# Patient Record
Sex: Male | Born: 1970 | Race: White | Hispanic: No | Marital: Married | Smoking: Never smoker
Health system: Southern US, Community
[De-identification: ages and names within clinical notes are randomized; demographics above are authoritative.]

## PROBLEM LIST (undated history)

## (undated) DIAGNOSIS — M329 Systemic lupus erythematosus, unspecified: Secondary | ICD-10-CM

## (undated) DIAGNOSIS — I10 Essential (primary) hypertension: Secondary | ICD-10-CM

## (undated) DIAGNOSIS — Z22322 Carrier or suspected carrier of Methicillin resistant Staphylococcus aureus: Secondary | ICD-10-CM

## (undated) DIAGNOSIS — E119 Type 2 diabetes mellitus without complications: Secondary | ICD-10-CM

## (undated) HISTORY — PX: KNEE ARTHROSCOPY: SUR90

## (undated) HISTORY — PX: APPENDECTOMY: SHX54

---

## 2018-09-07 ENCOUNTER — Emergency Department (HOSPITAL_COMMUNITY): Payer: Medicaid - Out of State

## 2018-09-07 ENCOUNTER — Encounter (HOSPITAL_COMMUNITY): Payer: Self-pay | Admitting: Emergency Medicine

## 2018-09-07 ENCOUNTER — Other Ambulatory Visit: Payer: Self-pay

## 2018-09-07 ENCOUNTER — Inpatient Hospital Stay (HOSPITAL_COMMUNITY)
Admission: EM | Admit: 2018-09-07 | Discharge: 2018-09-10 | DRG: 623 | Disposition: A | Discharge status: Home / Self Care | Payer: Medicaid - Out of State | Attending: Internal Medicine | Admitting: Internal Medicine

## 2018-09-07 DIAGNOSIS — Z794 Long term (current) use of insulin: Secondary | ICD-10-CM

## 2018-09-07 DIAGNOSIS — L97519 Non-pressure chronic ulcer of other part of right foot with unspecified severity: Secondary | ICD-10-CM | POA: Diagnosis present

## 2018-09-07 DIAGNOSIS — G4730 Sleep apnea, unspecified: Secondary | ICD-10-CM | POA: Diagnosis present

## 2018-09-07 DIAGNOSIS — Z79899 Other long term (current) drug therapy: Secondary | ICD-10-CM

## 2018-09-07 DIAGNOSIS — Z6841 Body Mass Index (BMI) 40.0 and over, adult: Secondary | ICD-10-CM

## 2018-09-07 DIAGNOSIS — B9562 Methicillin resistant Staphylococcus aureus infection as the cause of diseases classified elsewhere: Secondary | ICD-10-CM | POA: Diagnosis present

## 2018-09-07 DIAGNOSIS — E871 Hypo-osmolality and hyponatremia: Secondary | ICD-10-CM | POA: Diagnosis present

## 2018-09-07 DIAGNOSIS — E11621 Type 2 diabetes mellitus with foot ulcer: Secondary | ICD-10-CM | POA: Diagnosis present

## 2018-09-07 DIAGNOSIS — L03115 Cellulitis of right lower limb: Secondary | ICD-10-CM | POA: Diagnosis present

## 2018-09-07 DIAGNOSIS — Z23 Encounter for immunization: Secondary | ICD-10-CM | POA: Diagnosis not present

## 2018-09-07 DIAGNOSIS — E1142 Type 2 diabetes mellitus with diabetic polyneuropathy: Secondary | ICD-10-CM | POA: Diagnosis present

## 2018-09-07 DIAGNOSIS — I10 Essential (primary) hypertension: Secondary | ICD-10-CM | POA: Diagnosis present

## 2018-09-07 DIAGNOSIS — E11628 Type 2 diabetes mellitus with other skin complications: Secondary | ICD-10-CM | POA: Diagnosis present

## 2018-09-07 DIAGNOSIS — Z8614 Personal history of Methicillin resistant Staphylococcus aureus infection: Secondary | ICD-10-CM | POA: Diagnosis not present

## 2018-09-07 DIAGNOSIS — E119 Type 2 diabetes mellitus without complications: Secondary | ICD-10-CM

## 2018-09-07 DIAGNOSIS — L98493 Non-pressure chronic ulcer of skin of other sites with necrosis of muscle: Secondary | ICD-10-CM

## 2018-09-07 DIAGNOSIS — E1165 Type 2 diabetes mellitus with hyperglycemia: Secondary | ICD-10-CM | POA: Diagnosis present

## 2018-09-07 DIAGNOSIS — L089 Local infection of the skin and subcutaneous tissue, unspecified: Secondary | ICD-10-CM

## 2018-09-07 HISTORY — DX: Carrier or suspected carrier of methicillin resistant Staphylococcus aureus: Z22.322

## 2018-09-07 HISTORY — DX: Type 2 diabetes mellitus without complications: E11.9

## 2018-09-07 HISTORY — DX: Essential (primary) hypertension: I10

## 2018-09-07 LAB — BASIC METABOLIC PANEL
Anion gap: 10 (ref 5–15)
BUN: 16 mg/dL (ref 6–20)
CALCIUM: 8.7 mg/dL — AB (ref 8.9–10.3)
CO2: 26 mmol/L (ref 22–32)
Chloride: 97 mmol/L — ABNORMAL LOW (ref 98–111)
Creatinine, Ser: 1.33 mg/dL — ABNORMAL HIGH (ref 0.61–1.24)
GFR calc Af Amer: >60 mL/min (ref >60)
GFR calc non Af Amer: >60 mL/min (ref >60)
GLUCOSE: 395 mg/dL — AB (ref 70–99)
Potassium: 3.8 mmol/L (ref 3.5–5.1)
Sodium: 133 mmol/L — ABNORMAL LOW (ref 135–145)

## 2018-09-07 LAB — CBC WITH DIFFERENTIAL/PLATELET
ABS IMMATURE GRANULOCYTES: 0.02 10*3/uL (ref 0.00–0.07)
Basophils Absolute: 0.1 10*3/uL (ref 0.0–0.1)
Basophils Relative: 1 %
Eosinophils Absolute: 0.3 10*3/uL (ref 0.0–0.5)
Eosinophils Relative: 4 %
HCT: 41.7 % (ref 39.0–52.0)
Hemoglobin: 13.8 g/dL (ref 13.0–17.0)
Immature Granulocytes: 0 %
Lymphocytes Relative: 26 %
Lymphs Abs: 2 10*3/uL (ref 0.7–4.0)
MCH: 29.4 pg (ref 26.0–34.0)
MCHC: 33.1 g/dL (ref 30.0–36.0)
MCV: 88.7 fL (ref 80.0–100.0)
MONO ABS: 0.4 10*3/uL (ref 0.1–1.0)
MONOS PCT: 5 %
NEUTROS ABS: 4.9 10*3/uL (ref 1.7–7.7)
Neutrophils Relative %: 64 %
Platelets: 388 10*3/uL (ref 150–400)
RBC: 4.7 MIL/uL (ref 4.22–5.81)
RDW: 12 % (ref 11.5–15.5)
WBC: 7.7 10*3/uL (ref 4.0–10.5)
nRBC: 0 % (ref 0.0–0.2)

## 2018-09-07 LAB — HEMOGLOBIN A1C
HEMOGLOBIN A1C: 10.5 % — AB (ref 4.8–5.6)
Mean Plasma Glucose: 254.65 mg/dL

## 2018-09-07 LAB — GLUCOSE, CAPILLARY
GLUCOSE-CAPILLARY: 245 mg/dL — AB (ref 70–99)
Glucose-Capillary: 265 mg/dL — ABNORMAL HIGH (ref 70–99)

## 2018-09-07 LAB — SEDIMENTATION RATE: SED RATE: 28 mm/h — AB (ref 0–16)

## 2018-09-07 LAB — CBG MONITORING, ED: Glucose-Capillary: 402 mg/dL — ABNORMAL HIGH (ref 70–99)

## 2018-09-07 LAB — PROTIME-INR
INR: 0.83
PROTHROMBIN TIME: 11.3 s — AB (ref 11.4–15.2)

## 2018-09-07 LAB — C-REACTIVE PROTEIN: CRP: 1.8 mg/dL — AB (ref <1.0)

## 2018-09-07 MED ORDER — ENOXAPARIN SODIUM 40 MG/0.4ML ~~LOC~~ SOLN
40.0000 mg | SUBCUTANEOUS | Status: DC
  Administered 2018-09-07: 40 mg via SUBCUTANEOUS
  Filled 2018-09-07: qty 0.4

## 2018-09-07 MED ORDER — SODIUM CHLORIDE 0.9 % IV SOLN
250.0000 mL | INTRAVENOUS | Status: DC | PRN
  Administered 2018-09-08: 250 mL via INTRAVENOUS

## 2018-09-07 MED ORDER — LISINOPRIL 10 MG PO TABS
20.0000 mg | ORAL_TABLET | Freq: Every day | ORAL | Status: DC
  Administered 2018-09-08 – 2018-09-10 (×3): 20 mg via ORAL
  Filled 2018-09-07 (×3): qty 2

## 2018-09-07 MED ORDER — GLIPIZIDE 5 MG PO TABS
10.0000 mg | ORAL_TABLET | Freq: Every day | ORAL | Status: DC
  Filled 2018-09-07: qty 2
  Filled 2018-09-07 (×2): qty 1

## 2018-09-07 MED ORDER — ONDANSETRON HCL 4 MG/2ML IJ SOLN: 4.0000 mg | Freq: Four times a day (QID) | INTRAMUSCULAR | Status: DC | PRN

## 2018-09-07 MED ORDER — METFORMIN HCL 500 MG PO TABS
1000.0000 mg | ORAL_TABLET | Freq: Two times a day (BID) | ORAL | Status: DC
  Administered 2018-09-07 – 2018-09-10 (×6): 1000 mg via ORAL
  Filled 2018-09-07 (×6): qty 2

## 2018-09-07 MED ORDER — ROPINIROLE HCL 0.25 MG PO TABS
0.5000 mg | ORAL_TABLET | Freq: Every day | ORAL | Status: DC
  Administered 2018-09-07 – 2018-09-09 (×3): 0.5 mg via ORAL
  Filled 2018-09-07 (×2): qty 1
  Filled 2018-09-07 (×2): qty 2

## 2018-09-07 MED ORDER — ACETAMINOPHEN 325 MG PO TABS: 650.0000 mg | ORAL_TABLET | Freq: Four times a day (QID) | ORAL | Status: DC | PRN

## 2018-09-07 MED ORDER — VITAMIN D (ERGOCALCIFEROL) 1.25 MG (50000 UNIT) PO CAPS: 50000.0000 [IU] | ORAL_CAPSULE | ORAL | Status: DC

## 2018-09-07 MED ORDER — ESCITALOPRAM OXALATE 10 MG PO TABS
10.0000 mg | ORAL_TABLET | Freq: Every day | ORAL | Status: DC
  Administered 2018-09-08 – 2018-09-10 (×3): 10 mg via ORAL
  Filled 2018-09-07 (×3): qty 1

## 2018-09-07 MED ORDER — ACETAMINOPHEN 650 MG RE SUPP: 650.0000 mg | Freq: Four times a day (QID) | RECTAL | Status: DC | PRN

## 2018-09-07 MED ORDER — GABAPENTIN 100 MG PO CAPS
100.0000 mg | ORAL_CAPSULE | Freq: Three times a day (TID) | ORAL | Status: DC
  Administered 2018-09-07 – 2018-09-10 (×8): 100 mg via ORAL
  Filled 2018-09-07 (×8): qty 1

## 2018-09-07 MED ORDER — INSULIN ASPART 100 UNIT/ML IV SOLN
10.0000 [IU] | Freq: Once | INTRAVENOUS | Status: AC
  Administered 2018-09-07: 10 [IU] via INTRAVENOUS

## 2018-09-07 MED ORDER — SODIUM CHLORIDE 0.9% FLUSH: 3.0000 mL | INTRAVENOUS | Status: DC | PRN

## 2018-09-07 MED ORDER — INFLUENZA VAC SPLIT QUAD 0.5 ML IM SUSY
0.5000 mL | PREFILLED_SYRINGE | INTRAMUSCULAR | Status: AC
  Administered 2018-09-08: 0.5 mL via INTRAMUSCULAR

## 2018-09-07 MED ORDER — HYDROCHLOROTHIAZIDE 25 MG PO TABS
25.0000 mg | ORAL_TABLET | Freq: Every day | ORAL | Status: DC
  Administered 2018-09-08 – 2018-09-10 (×3): 25 mg via ORAL
  Filled 2018-09-07 (×3): qty 1

## 2018-09-07 MED ORDER — VANCOMYCIN HCL IN DEXTROSE 1-5 GM/200ML-% IV SOLN
1000.0000 mg | Freq: Two times a day (BID) | INTRAVENOUS | Status: DC
  Administered 2018-09-08 – 2018-09-09 (×4): 1000 mg via INTRAVENOUS
  Filled 2018-09-07 (×5): qty 200

## 2018-09-07 MED ORDER — SODIUM CHLORIDE 0.9% FLUSH
3.0000 mL | Freq: Two times a day (BID) | INTRAVENOUS | Status: DC
  Administered 2018-09-07 – 2018-09-10 (×5): 3 mL via INTRAVENOUS

## 2018-09-07 MED ORDER — INSULIN DETEMIR 100 UNIT/ML ~~LOC~~ SOLN
25.0000 [IU] | Freq: Every evening | SUBCUTANEOUS | Status: DC
  Administered 2018-09-07 – 2018-09-08 (×2): 25 [IU] via SUBCUTANEOUS
  Filled 2018-09-07 (×3): qty 0.25

## 2018-09-07 MED ORDER — AMITRIPTYLINE HCL 10 MG PO TABS
10.0000 mg | ORAL_TABLET | Freq: Every day | ORAL | Status: DC
  Administered 2018-09-08 – 2018-09-10 (×3): 10 mg via ORAL
  Filled 2018-09-07 (×3): qty 1

## 2018-09-07 MED ORDER — LISINOPRIL-HYDROCHLOROTHIAZIDE 20-25 MG PO TABS: 1.0000 | ORAL_TABLET | Freq: Every day | ORAL | Status: DC

## 2018-09-07 MED ORDER — AMMONIUM LACTATE 12 % EX LOTN
TOPICAL_LOTION | Freq: Every day | CUTANEOUS | Status: DC
  Filled 2018-09-07: qty 400

## 2018-09-07 MED ORDER — NIACIN 100 MG PO TABS
100.0000 mg | ORAL_TABLET | Freq: Every day | ORAL | Status: DC
  Administered 2018-09-10: 100 mg via ORAL
  Filled 2018-09-07 (×4): qty 1

## 2018-09-07 MED ORDER — INSULIN ASPART 100 UNIT/ML ~~LOC~~ SOLN
0.0000 [IU] | Freq: Every day | SUBCUTANEOUS | Status: DC
  Administered 2018-09-07: 2 [IU] via SUBCUTANEOUS
  Administered 2018-09-08: 5 [IU] via SUBCUTANEOUS

## 2018-09-07 MED ORDER — GLIPIZIDE ER 5 MG PO TB24
5.0000 mg | ORAL_TABLET | Freq: Every day | ORAL | Status: DC
  Filled 2018-09-07: qty 1

## 2018-09-07 MED ORDER — INSULIN ASPART 100 UNIT/ML ~~LOC~~ SOLN
0.0000 [IU] | Freq: Three times a day (TID) | SUBCUTANEOUS | Status: DC
  Administered 2018-09-07: 8 [IU] via SUBCUTANEOUS
  Administered 2018-09-08: 15 [IU] via SUBCUTANEOUS
  Administered 2018-09-08 (×2): 5 [IU] via SUBCUTANEOUS
  Administered 2018-09-09: 8 [IU] via SUBCUTANEOUS

## 2018-09-07 MED ORDER — ROPINIROLE HCL 0.25 MG PO TABS
ORAL_TABLET | ORAL | Status: AC
  Filled 2018-09-07: qty 2

## 2018-09-07 MED ORDER — VANCOMYCIN HCL 10 G IV SOLR
2000.0000 mg | Freq: Once | INTRAVENOUS | Status: AC
  Administered 2018-09-07: 2000 mg via INTRAVENOUS
  Filled 2018-09-07: qty 2000

## 2018-09-07 MED ORDER — VANCOMYCIN HCL IN DEXTROSE 750-5 MG/150ML-% IV SOLN
750.0000 mg | Freq: Two times a day (BID) | INTRAVENOUS | Status: DC
  Filled 2018-09-07 (×3): qty 150

## 2018-09-07 MED ORDER — MELOXICAM 7.5 MG PO TABS
15.0000 mg | ORAL_TABLET | Freq: Every day | ORAL | Status: DC
  Administered 2018-09-08 – 2018-09-10 (×3): 15 mg via ORAL
  Filled 2018-09-07 (×2): qty 2
  Filled 2018-09-07 (×2): qty 1
  Filled 2018-09-07: qty 2

## 2018-09-07 MED ORDER — ONDANSETRON HCL 4 MG PO TABS: 4.0000 mg | ORAL_TABLET | Freq: Four times a day (QID) | ORAL | Status: DC | PRN

## 2018-09-07 NOTE — ED Provider Notes (Signed)
[facility] Provider Note   CSN: [id number] Arrival date & time: 09/07/18  1318     History   Chief Complaint Chief Complaint  Patient presents with  . Abnormal Lab    HPI Evan Moore is a 47 y.o. male.  He has a history of diabetes and peripheral neuropathy.  He has had a nonhealing ulcer on the lateral side of his right foot for almost 2 months.  He follows with [name] who is been debriding it.  He has had a recent MRI per the patient that was negative for osteo-.  He is sent in today by [name] to be admitted to the hospitalist service for IV antibiotics in anticipation for surgery on Monday.  He said he is had on and off fevers usually when he is off antibiotics.  He recently completed 10 days of antibiotics and was off some for a couple of days when the fevers recurred.  He is currently on Cipro and Clinda for 2 days.  No nausea no vomiting no chest pain no shortness of breath.  Due to his neuropathy he has no foot pain.  The history is provided by the patient.  Wound Check  This is a chronic problem. Episode onset: 2 months. The problem occurs constantly. The problem has not changed since onset.Pertinent negatives include no chest pain, no abdominal pain, no headaches and no shortness of breath. Nothing aggravates the symptoms. Nothing relieves the symptoms. The treatment provided no relief.    Past Medical History:  Diagnosis Date  . Diabetes mellitus without complication (HCC)   . Hypertension   . MRSA (methicillin resistant staph aureus) culture positive     There are no active problems to display for this patient.   Past Surgical History:  Procedure Laterality Date  . APPENDECTOMY    . KNEE ARTHROSCOPY Left         Home Medications    Prior to Admission medications   Medication Sig Start Date End Date Taking? Authorizing Provider  amitriptyline (ELAVIL) 10 MG tablet Take 1 tablet by mouth daily. 08/31/18   [provider]    ammonium lactate (AMLACTIN) 12 % cream Apply 1 application topically at bedtime. 08/24/18   [provider]  ciprofloxacin (CIPRO) 500 MG tablet Take 1 tablet by mouth 2 (two) times daily. 09/05/18   [provider]  clindamycin (CLEOCIN) 300 MG capsule Take 1 capsule by mouth 3 (three) times daily. 09/05/18   [provider]  escitalopram (LEXAPRO) 10 MG tablet Take 10 mg by mouth daily. 08/31/18   [provider]  gabapentin (NEURONTIN) 100 MG capsule Take 1 capsule by mouth 3 (three) times daily. 08/19/18   [provider]  glipiZIDE (GLUCOTROL XL) 5 MG 24 hr tablet Take 1 tablet by mouth daily. 08/20/18   [provider]  glipiZIDE (GLUCOTROL) 10 MG tablet Take 1 tablet by mouth daily. 08/07/18   [provider]  LEVEMIR FLEXTOUCH 100 UNIT/ML Pen Inject 25 Units into the skin every evening. 08/16/18   [provider]  lisinopril-hydrochlorothiazide (PRINZIDE,ZESTORETIC) 20-25 MG tablet Take 1 tablet by mouth daily. 08/30/18   [provider]  meloxicam (MOBIC) 15 MG tablet Take 1 tablet by mouth daily. 08/20/18   [provider]  metFORMIN (GLUCOPHAGE) 1000 MG tablet Take 1 tablet by mouth 2 (two) times daily. 08/26/18   [provider]  niacin 100 MG tablet Take 100 mg by mouth daily. 08/30/18   [provider]  rOPINIRole (REQUIP) 0.5 MG tablet Take 1 tablet by mouth at bedtime. 08/30/18   [provider]  Vitamin D, Ergocalciferol, (DRISDOL) 1.25 MG (50000 UT) CAPS capsule Take 1 capsule by mouth once a week. 08/25/18   [provider]  VOLTAREN 1 % GEL Apply 1 application topically 3 (three) times daily as needed. 08/28/18   [provider]    Family History No family history on file.  Social History Social History   Tobacco Use  . Smoking status: Never Smoker  . Smokeless tobacco: Never Used  Substance Use Topics  . Alcohol use: Not Currently  . Drug  use: Not Currently     Allergies   Patient has no known allergies.   Review of Systems Review of Systems  Constitutional: Positive for fever. Negative for chills.  HENT: Negative for sore throat.   Eyes: Negative for visual disturbance.  Respiratory: Negative for shortness of breath.   Cardiovascular: Negative for chest pain.  Gastrointestinal: Negative for abdominal pain.  Genitourinary: Negative for dysuria.  Musculoskeletal: Negative for neck pain.  Skin: Positive for wound. Negative for rash.  Neurological: Negative for headaches.     Physical Exam Updated Vital Signs BP 116/69   Pulse 85   Temp 98.3 F (36.8 C) (Oral)   Resp 18   Ht 5\' 4"  (1.626 m)   Wt 114.8 kg   SpO2 97%   BMI 43.43 kg/m   Physical Exam  Constitutional: He appears well-developed and well-nourished.  HENT:  Head: Normocephalic and atraumatic.  Eyes: Conjunctivae are normal.  Neck: Neck supple.  Cardiovascular: Normal rate and regular rhythm.  No murmur heard. Pulmonary/Chest: Effort normal and breath sounds normal. No respiratory distress.  Abdominal: Soft. There is no tenderness.  Musculoskeletal: He exhibits no deformity.  Is approximately 2 cm ulceration on the lateral fifth metatarsal head of the right foot.  There is no discharge expressed.  There is a minimal amount of edema of the foot but no significant erythema.  He has decreased sensation on the foot.  Neurological: He is alert.  Skin: Skin is warm and dry.  Psychiatric: He has a normal mood and affect.  Nursing note and vitals reviewed.    ED Treatments / Results  Labs (all labs ordered are listed, but only abnormal results are displayed) Labs Reviewed  BASIC METABOLIC PANEL - Abnormal; Notable for the following components:      Result Value   Sodium 133 (*)    Chloride 97 (*)    Glucose, Bld 395 (*)    Creatinine, Ser 1.33 (*)    Calcium 8.7 (*)    All other components within normal limits  PROTIME-INR - Abnormal;  Notable for the following components:   Prothrombin Time 11.3 (*)    All other components within normal limits  CULTURE, BLOOD (ROUTINE X 2)  CULTURE, BLOOD (ROUTINE X 2)  CBC WITH DIFFERENTIAL/PLATELET    EKG None  Radiology Dg Foot Complete Right  Result Date: 09/07/2018 CLINICAL DATA:  Nonhealing foot ulcer. EXAM: RIGHT FOOT COMPLETE - 3+ VIEW COMPARISON:  None. FINDINGS: Lateral forefoot soft tissue swelling and cavitation. No opaque foreign body, soft tissue emphysema, or erosion. Diffuse arterial calcification. IMPRESSION: Lateral forefoot ulcer without evident osteomyelitis or opaque foreign body. Electronically Signed   By: Marnee Spring M.D.   On: 09/07/2018 15:44    Procedures Procedures (including critical care time)  Medications Ordered in ED Medications  vancomycin (VANCOCIN) 2,000 mg in sodium chloride  0.9 % 500 mL IVPB (2,000 mg Intravenous New Bag/Given 09/07/18 1425)  vancomycin (VANCOCIN) IVPB 1000 mg/200 mL premix (has no administration in time range)     Initial Impression / Assessment and Plan / ED Course  I have reviewed the triage vital signs and the nursing notes.  Pertinent labs & imaging results that were available during my care of the patient were reviewed by me and considered in my medical decision making (see chart for details).  Clinical Course as of Sep 07 1456  Sat Sep 07, 2018  1448 Discussed with Dr. Sherryll Burger from the hospitalist service who will evaluate the patient for admission.   [MB]  1456 Discussed with Dr. Allena Katz from podiatry who is asking for the patient to be admitted to medical service on IV antibiotics and he will stop by for evaluating the patient.  Anticipated for OR on Monday.   [MB]    Clinical Course User Index [MB] Terrilee Files, MD     Final Clinical Impressions(s) / ED Diagnoses   Final diagnoses:  Diabetic foot infection [facility])    ED Discharge Orders    None       Terrilee Files, MD 09/07/18 1734

## 2018-09-07 NOTE — ED Triage Notes (Signed)
Pt having right foot surgery Monday. Due to recent MRSA infection in area [place] sent him to ER to get antibiotics on board so they can do the surgery Monday. Pt on cipro and clindamycin PO already for infection. Pt reports having intermittent fevers

## 2018-09-07 NOTE — Consult Note (Signed)
Podiatry Consult Note.    HPI: Patient seen at bedside today in emergency room. Patient is well known to me, and is been following up with me for chronic ulceration of the right foot. The ulcer grew out MRSA which was sensitive to Clindamycin and I put him on 20 days of clindamycin. The redness never really resolved. When he came into the office on 11/2//19, his foot got worse. He noticed more redness, drainage, feverish. He states the fever comes and goes. I explained him that he needs IV antibiotics with debridement of his foot ulcer. I explained him to go to the ER right away. Him and his wife decided they will go on Saturday due to work. He has been taking clindamycin and cipro. Today he states the redness has improved. There is still drainage.   Past Medical History:  Diagnosis Date  . Diabetes mellitus without complication ([place])   . Hypertension   . MRSA (methicillin resistant staph aureus) culture positive          Past Surgical History:  Procedure Laterality Date  . APPENDECTOMY    . KNEE ARTHROSCOPY Left      reports that he has never smoked. He has never used smokeless tobacco. He reports that he drank alcohol. He reports that he has current or past drug history.  No Known Allergies  No family history on file.         Prior to Admission medications   Medication Sig Start Date End Date Taking? Authorizing Provider  amitriptyline (ELAVIL) 10 MG tablet Take 1 tablet by mouth daily. 08/31/18   [provider]  ammonium lactate (AMLACTIN) 12 % cream Apply 1 application topically at bedtime. 08/24/18   [provider]  ciprofloxacin (CIPRO) 500 MG tablet Take 1 tablet by mouth 2 (two) times daily. 09/05/18   [provider]  clindamycin (CLEOCIN) 300 MG capsule Take 1 capsule by mouth 3 (three) times daily. 09/05/18   [provider]  escitalopram (LEXAPRO) 10 MG tablet Take 10 mg by mouth daily. 08/31/18   [provider]  gabapentin (NEURONTIN) 100 MG capsule Take 1 capsule by mouth 3 (three) times daily. 08/19/18   [provider]  glipiZIDE (GLUCOTROL XL) 5 MG 24 hr tablet Take 1 tablet by mouth daily. 08/20/18   [provider]  glipiZIDE (GLUCOTROL) 10 MG tablet Take 1 tablet by mouth daily. 08/07/18   [provider]  LEVEMIR FLEXTOUCH 100 UNIT/ML Pen Inject 25 Units into the skin every evening. 08/16/18   [provider]  lisinopril-hydrochlorothiazide (PRINZIDE,ZESTORETIC) 20-25 MG tablet Take 1 tablet by mouth daily. 08/30/18   [provider]  meloxicam (MOBIC) 15 MG tablet Take 1 tablet by mouth daily. 08/20/18   [provider]  metFORMIN (GLUCOPHAGE) 1000 MG tablet Take 1 tablet by mouth 2 (two) times daily. 08/26/18   [provider]  niacin 100 MG tablet Take 100 mg by mouth daily. 08/30/18   [provider]  rOPINIRole (REQUIP) 0.5 MG tablet Take 1 tablet by mouth at bedtime. 08/30/18   [provider]  Vitamin D, Ergocalciferol, (DRISDOL) 1.25 MG (50000 UT) CAPS capsule Take 1 capsule by mouth once a week. 08/25/18   [provider]  VOLTAREN 1 % GEL Apply 1 application topically 3 (three) times daily as needed. 08/28/18   [provider]    Physical Exam:      Vitals:   09/07/18 1329 09/07/18 1330 09/07/18 1331  BP: 116/69  Pulse: 85    Resp: 18    Temp: 98.3 F (36.8 C)  98.3 F (36.8 C)  TempSrc: Oral  Oral  SpO2: 97%    Weight:  114.8 kg   Height:  _0  (1.626 m)     Constitutional: NAD, calm, comfortable, obese.      Vitals:   09/07/18 1329 09/07/18 1330 09/07/18 1331  BP: 116/69    Pulse: 85    Resp: 18    Temp: 98.3 F (36.8 C)  98.3 F (36.8 C)  TempSrc: Oral  Oral  SpO2: 97%    Weight:  114.8 kg   Height:  _1  (1.626 m)     CBC: LastLabs     Recent Labs  Lab 09/07/18 1359  WBC 7.7   NEUTROABS 4.9  HGB 13.8  HCT 41.7  MCV 88.7  PLT 388     ESR: Pending. CRP: Pending. A1C: Pending.   Arterial study with ABI and PVRs: Pending.   Physical Exam:  General, alert, awake and oriented.  Vascular: DP pulse faintly palpable. PT pulse faintly palpable. Hair growth noted on the lower leg but diminished on the foot. Foot is warm to touch. Capillary refill time is <3 seconds to the right fifth digit.  Derm: There Is full thickness ulcer noted at the right fifth MPJ laterally, with exposed muscle. Does not probe to bone. There is serosanginous drainage. No malodor noted. periwound erythema noted. No streaking redness noted. Ulcer is measured to be 1.5x1.5x0.5cm deep. No fluctuance noted.  Musculoskeletol: Muscle strength is 5/5.  Neurology: Protective sensation is diminished.   Radiograph: I reviewed the radiograph. No soft tissue emphysema noted. Calcified vessels noted. No bone destruction or bone lysis noted to suggest osteomyelitis.    Assessment/Plan  Right foot infected foot ulcer.  Right foot cellulitis.  Diabetes with neuropathy.    Patient examined and evaluated.  I cleasned the ulcer with normal saline and applied dry dressing.  Patient will need arterial studies done to evaluated the blood flow in the right lower extremity.  Continue IV vancomycin. Wound culture grew MRSA at the office.  Plan for OR debridement and possible application of wound Vac on 09/09/18. Patient will need to be NPO starting Sunday and hold off on Lovenox please.   Plan for discharge on Tuesday from podiatry point of view to home.

## 2018-09-07 NOTE — H&P (Signed)
History and Physical    Evan Moore ONG:[id number] DOB: 07/08/1971 DOA: 09/07/2018  PCP: Ian Malkinoberts, Maurice, FNP   Patient coming from: Home  Chief Complaint: MRSA on foot wound  HPI: Evan Moore is a 47 y.o. male with medical history significant for obesity, type 2 diabetes, peripheral neuropathy, and hypertension who has been dealing with a right foot wound on the lateral side over the last 2 months.  He claims that this may have begun after he was bitten by a spider and has been seen by podiatry and was placed on a course of antibiotics for 14 days.  Unfortunately, he is continued to have some ongoing trouble with the wound and was recently prescribed ciprofloxacin and clindamycin over the last 2 days and cultures were obtained in the office.  Apparently, cultures returned today demonstrated the presence of MRSA and the patient was told to come to the hospital for IV antibiotic treatment until debridement could be performed on Monday.  Patient denies any pain to his foot as he has severe neuropathy and states that he has had minimal clear drainage.  He denies any fevers or chills.  He does state that he has had an MRI about a week ago with no findings of osteomyelitis.   ED Course: Vital signs are stable and labs demonstrate some mild hyponatremia likely related to his severe hyperglycemia of 395.  He claims that he is compliant with his home medications and states that his sugar was checked shortly after lunch.  The rest of his labs are otherwise unremarkable.  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  Review of Systems: All others reviewed and otherwise negative.  Past Medical History:  Diagnosis Date  . Diabetes mellitus without complication (HCC)   . Hypertension   . MRSA (methicillin resistant staph aureus) culture positive     Past Surgical History:  Procedure Laterality Date  . APPENDECTOMY    . KNEE ARTHROSCOPY Left      reports that he has  never smoked. He has never used smokeless tobacco. He reports that he drank alcohol. He reports that he has current or past drug history.  No Known Allergies  No family history on file.  Prior to Admission medications   Medication Sig Start Date End Date Taking? Authorizing Provider  amitriptyline (ELAVIL) 10 MG tablet Take 1 tablet by mouth daily. 08/31/18   [provider]  ammonium lactate (AMLACTIN) 12 % cream Apply 1 application topically at bedtime. 08/24/18   [provider]  ciprofloxacin (CIPRO) 500 MG tablet Take 1 tablet by mouth 2 (two) times daily. 09/05/18   [provider]  clindamycin (CLEOCIN) 300 MG capsule Take 1 capsule by mouth 3 (three) times daily. 09/05/18   [provider]  escitalopram (LEXAPRO) 10 MG tablet Take 10 mg by mouth daily. 08/31/18   [provider]  gabapentin (NEURONTIN) 100 MG capsule Take 1 capsule by mouth 3 (three) times daily. 08/19/18   [provider]  glipiZIDE (GLUCOTROL XL) 5 MG 24 hr tablet Take 1 tablet by mouth daily. 08/20/18   [provider]  glipiZIDE (GLUCOTROL) 10 MG tablet Take 1 tablet by mouth daily. 08/07/18   [provider]  LEVEMIR FLEXTOUCH 100 UNIT/ML Pen Inject 25 Units into the skin every evening. 08/16/18   [provider]  lisinopril-hydrochlorothiazide (PRINZIDE,ZESTORETIC) 20-25 MG tablet Take 1 tablet by mouth daily. 08/30/18   [provider]  meloxicam (MOBIC) 15 MG tablet  Take 1 tablet by mouth daily. 08/20/18   [provider]  metFORMIN (GLUCOPHAGE) 1000 MG tablet Take 1 tablet by mouth 2 (two) times daily. 08/26/18   [provider]  niacin 100 MG tablet Take 100 mg by mouth daily. 08/30/18   [provider]  rOPINIRole (REQUIP) 0.5 MG tablet Take 1 tablet by mouth at bedtime. 08/30/18   [provider]  Vitamin D, Ergocalciferol, (DRISDOL) 1.25 MG (50000 UT) CAPS capsule Take 1 capsule by mouth  once a week. 08/25/18   [provider]  VOLTAREN 1 % GEL Apply 1 application topically 3 (three) times daily as needed. 08/28/18   [provider]    Physical Exam: Vitals:   09/07/18 1329 09/07/18 1330 09/07/18 1331  BP: 116/69    Pulse: 85    Resp: 18    Temp: 98.3 F (36.8 C)  98.3 F (36.8 C)  TempSrc: Oral  Oral  SpO2: 97%    Weight:  114.8 kg   Height:  5\' 4"  (1.626 m)     Constitutional: NAD, calm, comfortable, obese. Vitals:   09/07/18 1329 09/07/18 1330 09/07/18 1331  BP: 116/69    Pulse: 85    Resp: 18    Temp: 98.3 F (36.8 C)  98.3 F (36.8 C)  TempSrc: Oral  Oral  SpO2: 97%    Weight:  114.8 kg   Height:  5\' 4"  (1.626 m)    Eyes: lids and conjunctivae normal ENMT: Mucous membranes are moist.  Neck: normal, supple Respiratory: clear to auscultation bilaterally. Normal respiratory effort. No accessory muscle use.  Currently on room air. Cardiovascular: Regular rate and rhythm, no murmurs. No extremity edema. Abdomen: no tenderness, no distention. Bowel sounds positive.  Musculoskeletal:  No joint deformity upper and lower extremities.   Skin: no rashes, lesions, ulcers.  Right foot wound ulcer with granulomatous base and no discharge noted approximately 1.5 cm in diameter and circular.  No surrounding erythema currently noted.  No drainage. Psychiatric: Normal judgment and insight. Alert and oriented x 3. Normal mood.   Labs on Admission: I have personally reviewed following labs and imaging studies  CBC: Recent Labs  Lab 09/07/18 1359  WBC 7.7  NEUTROABS 4.9  HGB 13.8  HCT 41.7  MCV 88.7  PLT 388   Basic Metabolic Panel: Recent Labs  Lab 09/07/18 1359  NA 133*  K 3.8  CL 97*  CO2 26  GLUCOSE 395*  BUN 16  CREATININE 1.33*  CALCIUM 8.7*   GFR: Estimated Creatinine Clearance: 80.8 mL/min (A) (by C-G formula based on SCr of 1.33 mg/dL (H)). Liver Function Tests: No results for input(s): AST, ALT, ALKPHOS, BILITOT,  PROT, ALBUMIN in the last 168 hours. No results for input(s): LIPASE, AMYLASE in the last 168 hours. No results for input(s): AMMONIA in the last 168 hours. Coagulation Profile: Recent Labs  Lab 09/07/18 1359  INR 0.83   Cardiac Enzymes: No results for input(s): CKTOTAL, CKMB, CKMBINDEX, TROPONINI in the last 168 hours. BNP (last 3 results) No results for input(s): PROBNP in the last 8760 hours. HbA1C: No results for input(s): HGBA1C in the last 72 hours. CBG: No results for input(s): GLUCAP in the last 168 hours. Lipid Profile: No results for input(s): CHOL, HDL, LDLCALC, TRIG, CHOLHDL, LDLDIRECT in the last 72 hours. Thyroid Function Tests: No results for input(s): TSH, T4TOTAL, FREET4, T3FREE, THYROIDAB in the last 72 hours. Anemia Panel: No results for input(s): VITAMINB12, FOLATE, FERRITIN, TIBC, IRON, RETICCTPCT in  the last 72 hours. Urine analysis: No results found for: COLORURINE, APPEARANCEUR, LABSPEC, PHURINE, GLUCOSEU, HGBUR, BILIRUBINUR, KETONESUR, PROTEINUR, UROBILINOGEN, NITRITE, LEUKOCYTESUR  Radiological Exams on Admission: No results found.   Assessment/Plan Principal Problem:   Diabetic foot infection (HCC) Active Problems:   Essential hypertension   Type 2 diabetes mellitus without complication (HCC)   Right foot infection    1. Right foot lateral diabetic foot infection with MRSA and failed outpatient treatment.  Continue IV vancomycin with blood cultures that is been obtained already in the ED that we will follow.  Appreciate podiatry evaluation with likely debridement to take place by 11/25. 2. Diabetes type 2 with hyperglycemia.  Maintain on carb modified diet with sliding scale insulin along with home regimen.  Monitor carefully. 3. Essential hypertension.  Maintain on lisinopril/HCTZ with careful monitoring of blood pressures. 4. Peripheral neuropathy-diabetic.  Maintain on Requip and gabapentin.   DVT prophylaxis: Lovenox Code Status:  Full Family Communication: None at bedside Disposition Plan:Admit for IV antibiotics and debridement Consults called:Podiatry aware; Dr. Allena Katz Admission status: Inpatient, MedSurg   Gionni Vaca Hoover Brunette DO [facility] Pager [phone]  If 7PM-7AM, please contact night-coverage [email or web address] Password [facility]  09/07/2018, 3:07 PM

## 2018-09-07 NOTE — Progress Notes (Signed)
Pharmacy Antibiotic Note  Evan Moore is a 47 y.o. male admitted on 09/07/2018 with wound infection/ hx of MRSA.  Pharmacy has been consulted for Vancomycin dosing.  Plan: Vancomycin 2000 mg IV x 1 dose Vancomycin 1000 mg IV every 12 hours. Goal trough 15-20 mcg/mL. Monitor labs, c/s, and vanco trough as indicated.  Height: 5\' 4"  (162.6 cm) Weight: 253 lb (114.8 kg) IBW/kg (Calculated) : 59.2  Temp (24hrs), Avg:98.3 F (36.8 C), Min:98.3 F (36.8 C), Max:98.3 F (36.8 C)  Recent Labs  Lab 09/07/18 1359  WBC 7.7  CREATININE 1.33*    Estimated Creatinine Clearance: 80.8 mL/min (A) (by C-G formula based on SCr of 1.33 mg/dL (H)).    No Known Allergies  Antimicrobials this admission: Vanco 11/23 >>      Dose adjustments this admission: N/A  Microbiology results: 11/23 BCx: pending   Thank you for allowing pharmacy to be a part of this patient's care.  Evan MooreSteven C Mykael Moore 09/07/2018 2:42 PM

## 2018-09-08 LAB — CBC
HCT: 40.2 % (ref 39.0–52.0)
Hemoglobin: 13.4 g/dL (ref 13.0–17.0)
MCH: 30.5 pg (ref 26.0–34.0)
MCHC: 33.3 g/dL (ref 30.0–36.0)
MCV: 91.6 fL (ref 80.0–100.0)
Platelets: 336 K/uL (ref 150–400)
RBC: 4.39 MIL/uL (ref 4.22–5.81)
RDW: 11.9 % (ref 11.5–15.5)
WBC: 6.9 K/uL (ref 4.0–10.5)
nRBC: 0 % (ref 0.0–0.2)

## 2018-09-08 LAB — BASIC METABOLIC PANEL
ANION GAP: 6 (ref 5–15)
BUN: 22 mg/dL — ABNORMAL HIGH (ref 6–20)
CALCIUM: 9 mg/dL (ref 8.9–10.3)
CO2: 29 mmol/L (ref 22–32)
Chloride: 100 mmol/L (ref 98–111)
Creatinine, Ser: 1.21 mg/dL (ref 0.61–1.24)
GFR calc Af Amer: >60 mL/min (ref >60)
GFR calc non Af Amer: >60 mL/min (ref >60)
GLUCOSE: 268 mg/dL — AB (ref 70–99)
Potassium: 4 mmol/L (ref 3.5–5.1)
Sodium: 135 mmol/L (ref 135–145)

## 2018-09-08 LAB — GLUCOSE, CAPILLARY
Glucose-Capillary: 236 mg/dL — ABNORMAL HIGH (ref 70–99)
Glucose-Capillary: 211 mg/dL — ABNORMAL HIGH (ref 70–99)
Glucose-Capillary: 365 mg/dL — ABNORMAL HIGH (ref 70–99)
Glucose-Capillary: 362 mg/dL — ABNORMAL HIGH (ref 70–99)

## 2018-09-08 LAB — HIV ANTIBODY (ROUTINE TESTING W REFLEX): HIV Screen 4th Generation wRfx: NONREACTIVE

## 2018-09-08 MED ORDER — GLIPIZIDE 5 MG PO TABS
10.0000 mg | ORAL_TABLET | Freq: Every day | ORAL | Status: DC
  Administered 2018-09-08 – 2018-09-10 (×2): 10 mg via ORAL
  Filled 2018-09-08 (×2): qty 2

## 2018-09-08 MED ORDER — SODIUM CHLORIDE 0.9 % IV SOLN
INTRAVENOUS | Status: DC | PRN
  Administered 2018-09-09: 250 mL via INTRAVENOUS

## 2018-09-08 MED ORDER — GLIPIZIDE ER 5 MG PO TB24
5.0000 mg | ORAL_TABLET | Freq: Every day | ORAL | Status: DC
  Administered 2018-09-08 – 2018-09-10 (×2): 5 mg via ORAL
  Filled 2018-09-08 (×2): qty 1

## 2018-09-08 NOTE — Progress Notes (Signed)
PROGRESS NOTE    Evan Moore  ZOX:[id number] DOB: 04-08-1971 DOA: 09/07/2018 PCP: [name], FNP   Brief Narrative:  Per HPI: Evan Moore is a 47 y.o. male with medical history significant for obesity, type 2 diabetes, peripheral neuropathy, and hypertension who has been dealing with a right foot wound on the lateral side over the last 2 months.  He claims that this may have begun after he was bitten by a spider and has been seen by podiatry and was placed on a course of antibiotics for 14 days.  Unfortunately, he is continued to have some ongoing trouble with the wound and was recently prescribed ciprofloxacin and clindamycin over the last 2 days and cultures were obtained in the office.  Apparently, cultures returned today demonstrated the presence of MRSA and the patient was told to come to the hospital for IV antibiotic treatment until debridement could be performed on Monday.  Patient denies any pain to his foot as he has severe neuropathy and states that he has had minimal clear drainage.  He denies any fevers or chills.  He does state that he has had an MRI about a week ago with no findings of osteomyelitis.  Patient has been admitted for administration of IV vancomycin with plans for debridement on 11/25.   Assessment & Plan:   Principal Problem:   Diabetic foot infection (HCC) Active Problems:   Essential hypertension   Type 2 diabetes mellitus without complication (HCC)   Right foot infection   1. Right foot lateral diabetic foot infection with MRSA and failed outpatient treatment.  Continue IV vancomycin with blood cultures that is been obtained already in the ED that we will follow.  Appreciate podiatry evaluation with likely debridement to take place by 11/25. 2. Diabetes type 2 with hyperglycemia.  Maintain on carb modified diet with sliding scale insulin along with home regimen.  Monitor carefully. 3. Essential hypertension.  Maintain on lisinopril/HCTZ with  careful monitoring of blood pressures. 4. Peripheral neuropathy-diabetic.  Maintain on Requip and gabapentin.   DVT prophylaxis: Lovenox to be held today Code Status: Full Family Communication: None at bedside Disposition Plan: Continue IV vancomycin with plan debridement for a.m.   Consultants:   Podiatry Dr. Allena Katz  Procedures:   None  Antimicrobials:   IV vancomycin 11/23->   Subjective: Patient seen and evaluated today with no new acute complaints or concerns. No acute concerns or events noted overnight.  Objective: Vitals:   09/07/18 1723 09/07/18 2145 09/08/18 0600 09/08/18 0850  BP: 128/81 129/79 (!) 145/89 (!) 161/84  Pulse: 72 70 82   Resp: 16 18 16    Temp: 97.6 F (36.4 C) 98.2 F (36.8 C) (!) 97.3 F (36.3 C)   TempSrc: Oral Oral    SpO2: 96% 99% 100%   Weight:      Height:        Intake/Output Summary (Last 24 hours) at 09/08/2018 1051 Last data filed at 09/08/2018 0940 Gross per 24 hour  Intake 1220.18 ml  Output -  Net 1220.18 ml   Filed Weights   09/07/18 1330  Weight: 114.8 kg    Examination:  General exam: Appears calm and comfortable  Respiratory system: Clear to auscultation. Respiratory effort normal. Cardiovascular system: S1 & S2 heard, RRR. No JVD, murmurs, rubs, gallops or clicks. No pedal edema. Gastrointestinal system: Abdomen is nondistended, soft and nontender. No organomegaly or masses felt. Normal bowel sounds heard. Central nervous system: Alert and oriented. No focal neurological deficits. Extremities: Symmetric  5 x 5 power. Skin: No rashes, lesions or ulcers Psychiatry: Judgement and insight appear normal. Mood & affect appropriate.     Data Reviewed: I have personally reviewed following labs and imaging studies  CBC: Recent Labs  Lab 09/07/18 1359 09/08/18 0610  WBC 7.7 6.9  NEUTROABS 4.9  --   HGB 13.8 13.4  HCT 41.7 40.2  MCV 88.7 91.6  PLT 388 336   Basic Metabolic Panel: Recent Labs  Lab  09/07/18 1359 09/08/18 0610  NA 133* 135  K 3.8 4.0  CL 97* 100  CO2 26 29  GLUCOSE 395* 268*  BUN 16 22*  CREATININE 1.33* 1.21  CALCIUM 8.7* 9.0   GFR: Estimated Creatinine Clearance: 88.8 mL/min (by C-G formula based on SCr of 1.21 mg/dL). Liver Function Tests: No results for input(s): AST, ALT, ALKPHOS, BILITOT, PROT, ALBUMIN in the last 168 hours. No results for input(s): LIPASE, AMYLASE in the last 168 hours. No results for input(s): AMMONIA in the last 168 hours. Coagulation Profile: Recent Labs  Lab 09/07/18 1359  INR 0.83   Cardiac Enzymes: No results for input(s): CKTOTAL, CKMB, CKMBINDEX, TROPONINI in the last 168 hours. BNP (last 3 results) No results for input(s): PROBNP in the last 8760 hours. HbA1C: Recent Labs    09/07/18 1359  HGBA1C 10.5*   CBG: Recent Labs  Lab 09/07/18 1655 09/07/18 1849 09/07/18 2142 09/08/18 0809  GLUCAP 402* 265* 245* 236*   Lipid Profile: No results for input(s): CHOL, HDL, LDLCALC, TRIG, CHOLHDL, LDLDIRECT in the last 72 hours. Thyroid Function Tests: No results for input(s): TSH, T4TOTAL, FREET4, T3FREE, THYROIDAB in the last 72 hours. Anemia Panel: No results for input(s): VITAMINB12, FOLATE, FERRITIN, TIBC, IRON, RETICCTPCT in the last 72 hours. Sepsis Labs: No results for input(s): PROCALCITON, LATICACIDVEN in the last 168 hours.  Recent Results (from the past 240 hour(s))  Culture, blood (routine x 2)     Status: None (Preliminary result)   Collection Time: 09/07/18  2:02 PM  Result Value Ref Range Status   Specimen Description BLOOD  Final   Special Requests NONE  Final   Culture   Final    NO GROWTH < 24 HOURS Performed at [facility], [address], [place], [place] [address]    Report Status PENDING  Incomplete  Culture, blood (routine x 2)     Status: None (Preliminary result)   Collection Time: 09/07/18  2:05 PM  Result Value Ref Range Status   Specimen Description BLOOD  Final   Special Requests  NONE  Final   Culture   Final    NO GROWTH < 24 HOURS Performed at [facility], [address], [place], [place] [address]    Report Status PENDING  Incomplete         Radiology Studies: Dg Foot Complete Right  Result Date: 09/07/2018 CLINICAL DATA:  Nonhealing foot ulcer. EXAM: RIGHT FOOT COMPLETE - 3+ VIEW COMPARISON:  None. FINDINGS: Lateral forefoot soft tissue swelling and cavitation. No opaque foreign body, soft tissue emphysema, or erosion. Diffuse arterial calcification. IMPRESSION: Lateral forefoot ulcer without evident osteomyelitis or opaque foreign body. Electronically Signed   By: Marnee Spring M.D.   On: 09/07/2018 15:44        Scheduled Meds: . amitriptyline  10 mg Oral Daily  . ammonium lactate   Topical QHS  . enoxaparin (LOVENOX) injection  40 mg Subcutaneous Q24H  . escitalopram  10 mg Oral Daily  . gabapentin  100 mg Oral  TID  . glipiZIDE  5 mg Oral Q breakfast  . glipiZIDE  10 mg Oral QAC breakfast  . lisinopril  20 mg Oral Daily   And  . hydrochlorothiazide  25 mg Oral Daily  . insulin aspart  0-15 Units Subcutaneous TID WC  . insulin aspart  0-5 Units Subcutaneous QHS  . insulin detemir  25 Units Subcutaneous QPM  . meloxicam  15 mg Oral Daily  . metFORMIN  1,000 mg Oral BID WC  . niacin  100 mg Oral Daily  . rOPINIRole  0.5 mg Oral QHS  . sodium chloride flush  3 mL Intravenous Q12H  . Vitamin D (Ergocalciferol)  50,000 Units Oral Weekly   Continuous Infusions: . sodium chloride Stopped (09/08/18 0430)  . vancomycin 1,000 mg (09/08/18 0430)     LOS: 1 day    Time spent: 30 minutes    Makenli Derstine Hoover Brunette Fionna Merriott, DO [facility] Pager [phone]  If 7PM-7AM, please contact night-coverage [email or web address] Password [email or web address] 09/08/2018, 10:51 AM

## 2018-09-08 NOTE — Progress Notes (Signed)
Podiatry Progress Note:    S: Patient seen today at bedside. His wife is present in the room. Patient is feeling better. No complaints or new events since yesterday. Denies any calf pain or chest pain.   Physical exam:  Dressing is clean dry and intact with serosanguinous drainage noted on the bandage.  General, alert, awake and oriented.  Vascular: DP pulse faintly palpable. PT pulse faintly palpable. Hair growth noted on the lower leg but diminished on the foot. Foot is warm to touch. Capillary refill time is <3 seconds to the right fifth digit.  Derm: There Is full thickness ulcer noted at the right fifth MPJ laterally, with exposed muscle. Does not probe to bone. There is serosanginous drainage. No malodor noted. periwound erythema noted which is less compared to the Thursday in office. No streaking redness noted. Ulcer is measured to be 1.5x1.5x0.5cm deep. No fluctuance noted.  Musculoskeletol: Muscle strength is 5/5.  Neurology: Protective sensation is diminished.   Radiograph: I reviewed the radiograph. No soft tissue emphysema noted. Calcified vessels noted. No bone destruction or bone lysis noted to suggest osteomyelitis.   ESR and CRP elevated.  HbA1C is elevated. 10.5   Assessment/Plan  Right foot infected foot ulcer.  Right foot cellulitis.  Diabetes with neuropathy.    Patient examined and evaluated.  I cleasned the ulcer with normal saline and applied dry dressing.  NPO midnight tonight.  Pending arterial studies done to evaluated the blood flow in the right lower extremity.  Continue IV vancomycin. Wound culture grew MRSA at the office.  Plan for OR debridement and possible application of wound Vac on 09/09/18. Hold off on Lovenox please.   Plan for discharge on Tuesday from podiatry point of view to home.   

## 2018-09-09 ENCOUNTER — Encounter (HOSPITAL_COMMUNITY)
Admission: EM | Disposition: A | Discharge status: Home / Self Care | Payer: Self-pay | Source: Home / Self Care | Attending: Internal Medicine

## 2018-09-09 ENCOUNTER — Encounter (HOSPITAL_COMMUNITY): Payer: Self-pay | Admitting: Anesthesiology

## 2018-09-09 ENCOUNTER — Ambulatory Visit: Admit: 2018-09-09 | Payer: Medicaid - Out of State

## 2018-09-09 ENCOUNTER — Inpatient Hospital Stay (HOSPITAL_COMMUNITY): Payer: Medicaid - Out of State | Admitting: Anesthesiology

## 2018-09-09 ENCOUNTER — Inpatient Hospital Stay (HOSPITAL_COMMUNITY): Payer: Medicaid - Out of State

## 2018-09-09 HISTORY — PX: INCISION AND DRAINAGE: SHX5863

## 2018-09-09 HISTORY — PX: WOUND DEBRIDEMENT: SHX247

## 2018-09-09 LAB — GLUCOSE, RANDOM: GLUCOSE: 413 mg/dL — AB (ref 70–99)

## 2018-09-09 LAB — CBC
HCT: 39.6 % (ref 39.0–52.0)
Hemoglobin: 13 g/dL (ref 13.0–17.0)
MCH: 29.7 pg (ref 26.0–34.0)
MCHC: 32.8 g/dL (ref 30.0–36.0)
MCV: 90.6 fL (ref 80.0–100.0)
NRBC: 0 % (ref 0.0–0.2)
Platelets: 329 10*3/uL (ref 150–400)
RBC: 4.37 MIL/uL (ref 4.22–5.81)
RDW: 12.1 % (ref 11.5–15.5)
WBC: 6.8 10*3/uL (ref 4.0–10.5)

## 2018-09-09 LAB — GLUCOSE, CAPILLARY
GLUCOSE-CAPILLARY: 149 mg/dL — AB (ref 70–99)
Glucose-Capillary: 268 mg/dL — ABNORMAL HIGH (ref 70–99)
Glucose-Capillary: 191 mg/dL — ABNORMAL HIGH (ref 70–99)
Glucose-Capillary: 126 mg/dL — ABNORMAL HIGH (ref 70–99)
Glucose-Capillary: 418 mg/dL — ABNORMAL HIGH (ref 70–99)

## 2018-09-09 LAB — BASIC METABOLIC PANEL
Anion gap: 8 (ref 5–15)
BUN: 21 mg/dL — ABNORMAL HIGH (ref 6–20)
CHLORIDE: 100 mmol/L (ref 98–111)
CO2: 26 mmol/L (ref 22–32)
Calcium: 9 mg/dL (ref 8.9–10.3)
Creatinine, Ser: 1.35 mg/dL — ABNORMAL HIGH (ref 0.61–1.24)
GFR calc non Af Amer: >60 mL/min (ref >60)
Glucose, Bld: 355 mg/dL — ABNORMAL HIGH (ref 70–99)
POTASSIUM: 4.2 mmol/L (ref 3.5–5.1)
SODIUM: 134 mmol/L — AB (ref 135–145)

## 2018-09-09 SURGERY — INCISION AND DRAINAGE
Anesthesia: Monitor Anesthesia Care | Site: Foot | Laterality: Right

## 2018-09-09 MED ORDER — MIDAZOLAM HCL 2 MG/2ML IJ SOLN
INTRAMUSCULAR | Status: AC
  Filled 2018-09-09: qty 2

## 2018-09-09 MED ORDER — LIDOCAINE HCL (PF) 1 % IJ SOLN
INTRAMUSCULAR | Status: AC
  Filled 2018-09-09: qty 30

## 2018-09-09 MED ORDER — FENTANYL CITRATE (PF) 100 MCG/2ML IJ SOLN
INTRAMUSCULAR | Status: DC | PRN
  Administered 2018-09-09: 25 ug via INTRAVENOUS

## 2018-09-09 MED ORDER — INSULIN ASPART 100 UNIT/ML ~~LOC~~ SOLN
0.0000 [IU] | Freq: Three times a day (TID) | SUBCUTANEOUS | Status: DC
  Administered 2018-09-09: 4 [IU] via SUBCUTANEOUS
  Administered 2018-09-10: 11 [IU] via SUBCUTANEOUS

## 2018-09-09 MED ORDER — INSULIN ASPART 100 UNIT/ML ~~LOC~~ SOLN
6.0000 [IU] | Freq: Three times a day (TID) | SUBCUTANEOUS | Status: DC
  Administered 2018-09-10: 6 [IU] via SUBCUTANEOUS

## 2018-09-09 MED ORDER — PROPOFOL 500 MG/50ML IV EMUL
INTRAVENOUS | Status: DC | PRN
  Administered 2018-09-09: 75 ug/kg/min via INTRAVENOUS

## 2018-09-09 MED ORDER — HYDROMORPHONE HCL 1 MG/ML IJ SOLN: 0.2500 mg | INTRAMUSCULAR | Status: DC | PRN

## 2018-09-09 MED ORDER — PROPOFOL 10 MG/ML IV BOLUS
INTRAVENOUS | Status: DC | PRN
  Administered 2018-09-09: 30 mg via INTRAVENOUS

## 2018-09-09 MED ORDER — BUPIVACAINE HCL (PF) 0.5 % IJ SOLN
INTRAMUSCULAR | Status: AC
  Filled 2018-09-09: qty 30

## 2018-09-09 MED ORDER — MIDAZOLAM HCL 5 MG/5ML IJ SOLN
INTRAMUSCULAR | Status: DC | PRN
  Administered 2018-09-09: 2 mg via INTRAVENOUS

## 2018-09-09 MED ORDER — INSULIN ASPART 100 UNIT/ML ~~LOC~~ SOLN: 0.0000 [IU] | Freq: Every day | SUBCUTANEOUS | Status: DC

## 2018-09-09 MED ORDER — KETOROLAC TROMETHAMINE 30 MG/ML IJ SOLN: 30.0000 mg | Freq: Once | INTRAMUSCULAR | Status: DC | PRN

## 2018-09-09 MED ORDER — 0.9 % SODIUM CHLORIDE (POUR BTL) OPTIME
TOPICAL | Status: DC | PRN
  Administered 2018-09-09: 1000 mL

## 2018-09-09 MED ORDER — HYDROCODONE-ACETAMINOPHEN 7.5-325 MG PO TABS: 1.0000 | ORAL_TABLET | Freq: Once | ORAL | Status: DC | PRN

## 2018-09-09 MED ORDER — FENTANYL CITRATE (PF) 100 MCG/2ML IJ SOLN
INTRAMUSCULAR | Status: AC
  Filled 2018-09-09: qty 2

## 2018-09-09 MED ORDER — INSULIN DETEMIR 100 UNIT/ML ~~LOC~~ SOLN
30.0000 [IU] | Freq: Every evening | SUBCUTANEOUS | Status: DC
  Filled 2018-09-09 (×2): qty 0.3

## 2018-09-09 MED ORDER — LIDOCAINE HCL (CARDIAC) PF 50 MG/5ML IV SOSY
PREFILLED_SYRINGE | INTRAVENOUS | Status: DC | PRN
  Administered 2018-09-09: 40 mg via INTRAVENOUS

## 2018-09-09 MED ORDER — SODIUM CHLORIDE 0.9 % IR SOLN
Status: DC | PRN
  Administered 2018-09-09: 3000 mL

## 2018-09-09 MED ORDER — PROPOFOL 10 MG/ML IV BOLUS
INTRAVENOUS | Status: AC
  Filled 2018-09-09: qty 40

## 2018-09-09 MED ORDER — GLYCOPYRROLATE 0.2 MG/ML IJ SOLN
INTRAMUSCULAR | Status: DC | PRN
  Administered 2018-09-09: 0.2 mg via INTRAVENOUS

## 2018-09-09 MED ORDER — LIDOCAINE HCL 1 % IJ SOLN
INTRAMUSCULAR | Status: DC | PRN
  Administered 2018-09-09 (×2): 10 mL via INTRAMUSCULAR

## 2018-09-09 MED ORDER — ONDANSETRON HCL 4 MG/2ML IJ SOLN: 4.0000 mg | Freq: Once | INTRAMUSCULAR | Status: DC | PRN

## 2018-09-09 MED ORDER — MEPERIDINE HCL 50 MG/ML IJ SOLN: 6.2500 mg | INTRAMUSCULAR | Status: DC | PRN

## 2018-09-09 MED ORDER — LACTATED RINGERS IV SOLN
INTRAVENOUS | Status: DC
  Administered 2018-09-09: 1000 mL via INTRAVENOUS

## 2018-09-09 SURGICAL SUPPLY — 43 items
BANDAGE ELASTIC 2 LF NS (GAUZE/BANDAGES/DRESSINGS) ×4 IMPLANT
BANDAGE ELASTIC 4 LF NS (GAUZE/BANDAGES/DRESSINGS) ×4 IMPLANT
BANDAGE ESMARK 4X12 BL STRL LF (DISPOSABLE) ×2 IMPLANT
BENZOIN TINCTURE PRP APPL 2/3 (GAUZE/BANDAGES/DRESSINGS) ×4 IMPLANT
BNDG CONFORM 2 STRL LF (GAUZE/BANDAGES/DRESSINGS) ×4 IMPLANT
BNDG ESMARK 4X12 BLUE STRL LF (DISPOSABLE) ×4
BNDG GAUZE ELAST 4 BULKY (GAUZE/BANDAGES/DRESSINGS) ×4 IMPLANT
CLOTH BEACON ORANGE TIMEOUT ST (SAFETY) ×4 IMPLANT
COVER LIGHT HANDLE STERIS (MISCELLANEOUS) ×8 IMPLANT
CUFF TOURNIQUET SINGLE 18IN (TOURNIQUET CUFF) ×4 IMPLANT
DECANTER SPIKE VIAL GLASS SM (MISCELLANEOUS) ×4 IMPLANT
DRSG ADAPTIC 3X8 NADH LF (GAUZE/BANDAGES/DRESSINGS) ×4 IMPLANT
ELECT REM PT RETURN 9FT ADLT (ELECTROSURGICAL) ×4
ELECTRODE REM PT RTRN 9FT ADLT (ELECTROSURGICAL) ×2 IMPLANT
GAUZE IODOFORM PACK 1/2 7832 (GAUZE/BANDAGES/DRESSINGS) ×4 IMPLANT
GAUZE PACKING IODOFORM 1/4X15 (GAUZE/BANDAGES/DRESSINGS) ×4 IMPLANT
GAUZE SPONGE 4X4 12PLY STRL (GAUZE/BANDAGES/DRESSINGS) ×4 IMPLANT
GLOVE BIO SURGEON STRL SZ7 (GLOVE) ×4 IMPLANT
GLOVE BIO SURGEON STRL SZ7.5 (GLOVE) ×4 IMPLANT
GLOVE BIOGEL PI IND STRL 7.0 (GLOVE) ×4 IMPLANT
GLOVE BIOGEL PI IND STRL 7.5 (GLOVE) ×2 IMPLANT
GLOVE BIOGEL PI INDICATOR 7.0 (GLOVE) ×4
GLOVE BIOGEL PI INDICATOR 7.5 (GLOVE) ×2
GLOVE ECLIPSE 7.0 STRL STRAW (GLOVE) ×4 IMPLANT
GOWN STRL REUS W/ TWL LRG LVL3 (GOWN DISPOSABLE) ×2 IMPLANT
GOWN STRL REUS W/TWL LRG LVL3 (GOWN DISPOSABLE) ×10 IMPLANT
HANDPIECE INTERPULSE COAX TIP (DISPOSABLE) ×2
IV NS IRRIG 3000ML ARTHROMATIC (IV SOLUTION) ×4 IMPLANT
KIT TURNOVER KIT A (KITS) ×4 IMPLANT
MARKER SKIN DUAL TIP RULER LAB (MISCELLANEOUS) ×4 IMPLANT
NEEDLE HYPO 25X1 1.5 SAFETY (NEEDLE) ×16 IMPLANT
NS IRRIG 1000ML POUR BTL (IV SOLUTION) ×4 IMPLANT
PACK BASIC LIMB (CUSTOM PROCEDURE TRAY) ×4 IMPLANT
PAD ABD 5X9 TENDERSORB (GAUZE/BANDAGES/DRESSINGS) ×4 IMPLANT
PAD ARMBOARD 7.5X6 YLW CONV (MISCELLANEOUS) ×4 IMPLANT
SET BASIN LINEN APH (SET/KITS/TRAYS/PACK) ×4 IMPLANT
SET HNDPC FAN SPRY TIP SCT (DISPOSABLE) ×2 IMPLANT
SUT ETHILON 3 0 FSL (SUTURE) ×4 IMPLANT
SUT MON AB 2-0 CT1 36 (SUTURE) ×4 IMPLANT
SUT VIC AB 4-0 PS2 27 (SUTURE) ×4 IMPLANT
SUT VICRYL AB 3-0 FS1 BRD 27IN (SUTURE) ×4 IMPLANT
SYR 30ML LL (SYRINGE) ×4 IMPLANT
SYR CONTROL 10ML LL (SYRINGE) ×8 IMPLANT

## 2018-09-09 NOTE — Progress Notes (Signed)
Podiatry progress note:    Patient seen and evaluated.Patient is NPO since midnight. No acute events over night. Patient is undergoing procedure today with me for the following diagnosis.   Pre-op diagnosis: Right foot infected ulcer.   Planned procedure : Right foot ulcer debridement and pulse lavage.   Plan: Patient will be discharge home tomorrow.  Wound care instruction to follow.

## 2018-09-09 NOTE — Anesthesia Preprocedure Evaluation (Addendum)
Anesthesia Evaluation  Patient identified by MRN, date of birth, ID band Patient awake    Reviewed: Allergy & Precautions, H&P , NPO status , Patient's Chart, lab work & pertinent test results  Airway Mallampati: III  TM Distance: >3 FB Neck ROM: full    Dental no notable dental hx.    Pulmonary sleep apnea ,  Won't wear CPAP   Pulmonary exam normal breath sounds clear to auscultation       Cardiovascular Exercise Tolerance: Good hypertension, negative cardio ROS   Rhythm:regular Rate:Normal     Neuro/Psych neuropathy negative neurological ROS  negative psych ROS   GI/Hepatic negative GI ROS, Neg liver ROS,   Endo/Other  diabetesMorbid obesity  Renal/GU negative Renal ROS  negative genitourinary   Musculoskeletal   Abdominal   Peds  Hematology negative hematology ROS (+)   Anesthesia Other Findings   Reproductive/Obstetrics negative OB ROS                             Anesthesia Physical Anesthesia Plan  ASA: III  Anesthesia Plan: MAC   Post-op Pain Management:    Induction:   PONV Risk Score and Plan:   Airway Management Planned:   Additional Equipment:   Intra-op Plan:   Post-operative Plan:   Informed Consent: I have reviewed the patients History and Physical, chart, labs and discussed the procedure including the risks, benefits and alternatives for the proposed anesthesia with the patient or authorized representative who has indicated his/her understanding and acceptance.   Dental Advisory Given  Plan Discussed with: CRNA  Anesthesia Plan Comments:         Anesthesia Quick Evaluation

## 2018-09-09 NOTE — Anesthesia Procedure Notes (Signed)
Procedure Name: [place] Performed by: Andree Elk Amy A, CRNA Pre-anesthesia Checklist: Patient identified, Emergency Drugs available, Suction available, Timeout performed and Patient being monitored Patient Re-evaluated:Patient Re-evaluated prior to induction Oxygen Delivery Method: Non-rebreather mask

## 2018-09-09 NOTE — Progress Notes (Signed)
PROGRESS NOTE    Evan Moore  ZOX:[id number] DOB: Sep 25, 1971 DOA: 09/07/2018 PCP: [name], FNP   Brief Narrative:  Per HPI: Evan Moore a 47 y.o.malewith medical history significant forobesity, type 2 diabetes, peripheral neuropathy, and hypertension who has been dealing with a right foot wound on the lateral side over the last 2 months. He claims that this may have begun after he was bitten by a spider and has been seen by podiatry and was placed on a course of antibiotics for 14 days. Unfortunately, he is continued to have some ongoing trouble with the wound and was recently prescribed ciprofloxacin and clindamycin over the last 2 days and cultures were obtained in the office. Apparently, cultures returned today demonstrated the presence of MRSA and the patient was told to come to the hospital for IV antibiotic treatment until debridement could be performed on Monday. Patient denies any pain to his foot as he has severe neuropathy and states that he has had minimal clear drainage. He denies any fevers or chills. He does state that he has had an MRI about a week ago with no findings of osteomyelitis.  Patient has been admitted for administration of IV vancomycin with plans for debridement on 11/25.  Assessment & Plan:   Principal Problem:   Diabetic foot infection (HCC) Active Problems:   Essential hypertension   Type 2 diabetes mellitus without complication (HCC)   Right foot infection  1. Right foot lateral diabetic foot infection with MRSA and failed outpatient treatment. Continue IV vancomycin with blood cultures that is been obtained already in the ED that we will follow. Appreciate podiatry evaluation with debridement planned for this afternoon. 2. Diabetes type 2 with hyperglycemia. Maintain on carb modified diet with sliding scale insulin along with home regimen. Monitor carefully.  Increase Levemir to 30 units at bedtime with 6 units NovoLog added to  meals and sliding scale changed to resistant.  Hemoglobin A1c noted to be 10.5%. 3. Essential hypertension. Maintain on lisinopril/HCTZ with careful monitoring of blood pressures. 4. Peripheral neuropathy-diabetic. Maintain on Requip and gabapentin.   DVT prophylaxis:  Lovenox held for anticipated procedure. Code Status: Full Family Communication: None at bedside Disposition Plan: Continue IV vancomycin with plan debridement today.   Consultants:   Podiatry Dr. Allena Katz  Procedures:   None  Antimicrobials:   IV vancomycin 11/23->  Subjective: Patient seen and evaluated today with no new acute complaints or concerns. No acute concerns or events noted overnight.  Blood glucose readings continue to remain somewhat elevated.  Objective: Vitals:   09/08/18 1427 09/08/18 2149 09/09/18 0547 09/09/18 0849  BP: 118/79 130/74 (!) 154/91 118/80  Pulse: 72 74 78   Resp: 16 19 18    Temp: 98.1 F (36.7 C) 98.3 F (36.8 C) 98 F (36.7 C)   TempSrc: Oral Oral Oral   SpO2: 95% 98% 96%   Weight:      Height:        Intake/Output Summary (Last 24 hours) at 09/09/2018 0919 Last data filed at 09/09/2018 0400 Gross per 24 hour  Intake 1583.16 ml  Output -  Net 1583.16 ml   Filed Weights   09/07/18 1330  Weight: 114.8 kg    Examination:  General exam: Appears calm and comfortable, obese Respiratory system: Clear to auscultation. Respiratory effort normal. Cardiovascular system: S1 & S2 heard, RRR. No JVD, murmurs, rubs, gallops or clicks. No pedal edema. Gastrointestinal system: Abdomen is nondistended, soft and nontender. No organomegaly or masses felt. Normal bowel  sounds heard. Central nervous system: Alert and oriented. No focal neurological deficits. Extremities: Symmetric 5 x 5 power. Skin: No rashes, lesions or ulcers, foot wound noted to be stable. Psychiatry: Judgement and insight appear normal. Mood & affect appropriate.     Data Reviewed: I have personally  reviewed following labs and imaging studies  CBC: Recent Labs  Lab 09/07/18 1359 09/08/18 0610 09/09/18 0542  WBC 7.7 6.9 6.8  NEUTROABS 4.9  --   --   HGB 13.8 13.4 13.0  HCT 41.7 40.2 39.6  MCV 88.7 91.6 90.6  PLT 388 336 329   Basic Metabolic Panel: Recent Labs  Lab 09/07/18 1359 09/08/18 0610 09/09/18 0542  NA 133* 135 134*  K 3.8 4.0 4.2  CL 97* 100 100  CO2 26 29 26   GLUCOSE 395* 268* 355*  BUN 16 22* 21*  CREATININE 1.33* 1.21 1.35*  CALCIUM 8.7* 9.0 9.0   GFR: Estimated Creatinine Clearance: 79.6 mL/min (A) (by C-G formula based on SCr of 1.35 mg/dL (H)). Liver Function Tests: No results for input(s): AST, ALT, ALKPHOS, BILITOT, PROT, ALBUMIN in the last 168 hours. No results for input(s): LIPASE, AMYLASE in the last 168 hours. No results for input(s): AMMONIA in the last 168 hours. Coagulation Profile: Recent Labs  Lab 09/07/18 1359  INR 0.83   Cardiac Enzymes: No results for input(s): CKTOTAL, CKMB, CKMBINDEX, TROPONINI in the last 168 hours. BNP (last 3 results) No results for input(s): PROBNP in the last 8760 hours. HbA1C: Recent Labs    09/07/18 1359  HGBA1C 10.5*   CBG: Recent Labs  Lab 09/08/18 0809 09/08/18 1131 09/08/18 1633 09/08/18 2150 09/09/18 0756  GLUCAP 236* 211* 365* 362* 268*   Lipid Profile: No results for input(s): CHOL, HDL, LDLCALC, TRIG, CHOLHDL, LDLDIRECT in the last 72 hours. Thyroid Function Tests: No results for input(s): TSH, T4TOTAL, FREET4, T3FREE, THYROIDAB in the last 72 hours. Anemia Panel: No results for input(s): VITAMINB12, FOLATE, FERRITIN, TIBC, IRON, RETICCTPCT in the last 72 hours. Sepsis Labs: No results for input(s): PROCALCITON, LATICACIDVEN in the last 168 hours.  Recent Results (from the past 240 hour(s))  Culture, blood (routine x 2)     Status: None (Preliminary result)   Collection Time: 09/07/18  2:02 PM  Result Value Ref Range Status   Specimen Description BLOOD LEFT ARM  Final    Special Requests   Final    BOTTLES DRAWN AEROBIC AND ANAEROBIC Blood Culture adequate volume   Culture   Final    NO GROWTH 2 DAYS Performed at [facility], [address] [place], [place] [address]    Report Status PENDING  Incomplete  Culture, blood (routine x 2)     Status: None (Preliminary result)   Collection Time: 09/07/18  2:05 PM  Result Value Ref Range Status   Specimen Description BLOOD LEFT HAND  Final   Special Requests   Final    BOTTLES DRAWN AEROBIC AND ANAEROBIC Blood Culture adequate volume   Culture   Final    NO GROWTH 2 DAYS Performed at [facility], [address] [place], [place] [address]    Report Status PENDING  Incomplete         Radiology Studies: Dg Foot Complete Right  Result Date: 09/07/2018 CLINICAL DATA:  Nonhealing foot ulcer. EXAM: RIGHT FOOT COMPLETE - 3+ VIEW COMPARISON:  None. FINDINGS: Lateral forefoot soft tissue swelling and cavitation. No opaque foreign body, soft tissue emphysema, or erosion. Diffuse arterial calcification. IMPRESSION: Lateral forefoot  ulcer without evident osteomyelitis or opaque foreign body. Electronically Signed   By: Marnee Spring M.D.   On: 09/07/2018 15:44        Scheduled Meds: . amitriptyline  10 mg Oral Daily  . ammonium lactate   Topical QHS  . escitalopram  10 mg Oral Daily  . gabapentin  100 mg Oral TID  . glipiZIDE  5 mg Oral Q breakfast  . glipiZIDE  10 mg Oral QAC breakfast  . lisinopril  20 mg Oral Daily   And  . hydrochlorothiazide  25 mg Oral Daily  . insulin aspart  0-20 Units Subcutaneous TID WC  . insulin aspart  0-5 Units Subcutaneous QHS  . insulin aspart  6 Units Subcutaneous TID WC  . insulin detemir  30 Units Subcutaneous QPM  . meloxicam  15 mg Oral Daily  . metFORMIN  1,000 mg Oral BID WC  . niacin  100 mg Oral Daily  . rOPINIRole  0.5 mg Oral QHS  . sodium chloride flush  3 mL Intravenous Q12H  . Vitamin D (Ergocalciferol)  50,000 Units Oral Weekly   Continuous  Infusions: . sodium chloride 10 mL/hr at 09/08/18 1529  . sodium chloride 250 mL (09/09/18 0359)  . vancomycin 1,000 mg (09/09/18 0400)     LOS: 2 days    Time spent: 30 minutes    Connar Keating Hoover Brunette, DO [facility] Pager [phone]  If 7PM-7AM, please contact night-coverage [email or web address] Password [facility] 09/09/2018, 9:19 AM

## 2018-09-09 NOTE — Anesthesia Postprocedure Evaluation (Signed)
Anesthesia Post Note  Patient: Evan Moore  Procedure(s) Performed: INCISION AND DRAINAGE RIGHT FOOT ULCER (Right Foot) DEBRIDEMENT OF NONVIABLE TISSUE RIGHT FOOT (Right Foot)  Patient location during evaluation: PACU Anesthesia Type: MAC Level of consciousness: awake and alert and oriented Pain management: pain level controlled Vital Signs Assessment: post-procedure vital signs reviewed and stable Respiratory status: spontaneous breathing Cardiovascular status: stable Postop Assessment: no apparent nausea or vomiting Anesthetic complications: no     Last Vitals:  Vitals:   09/09/18 0849 09/09/18 1453  BP: 118/80 139/89  Pulse:  69  Resp:  16  Temp:  36.6 C  SpO2:  95%    Last Pain:  Vitals:   09/09/18 1453  TempSrc: Oral  PainSc: 1                  Danthony Kendrix A

## 2018-09-09 NOTE — Progress Notes (Signed)
Inpatient Diabetes Program Recommendations  AACE/ADA: New Consensus Statement on Inpatient Glycemic Control (2015)  Target Ranges:  Prepandial:   less than 140 mg/dL      Peak postprandial:   less than 180 mg/dL (1-2 hours)      Critically ill patients:  140 - 180 mg/dL   Results for Monna FamINTER, Andruw (MRN [id number]) as of 09/09/2018 08:13  Ref. Range 09/08/2018 08:09 09/08/2018 11:31 09/08/2018 16:33 09/08/2018 21:50  Glucose-Capillary Latest Ref Range: 70 - 99 mg/dL 469236 (H)  5 units NOVOLOG 211 (H)  5 units NOVOLOG 365 (H)  15 units NOVOLOG +  25 units LEVEMIR 362 (H)  5 units NOVOLOG    Results for Monna FamINTER, Ariyon (MRN [id number]) as of 09/09/2018 08:13  Ref. Range 09/09/2018 07:56  Glucose-Capillary Latest Ref Range: 70 - 99 mg/dL 244268 (H)   Results for Monna FamINTER, Rashaun (MRN [id number]) as of 09/09/2018 08:13  Ref. Range 09/07/2018 13:59  Hemoglobin A1C Latest Ref Range: 4.8 - 5.6 % 10.5 (H)  (254 mg/dl)    Admit with: R foot lateral diabetic foot infection with MRSA and failed outpatient treatment  History: DM  Home DM Meds: Levemir 25 units QPM       Glipizide 15 mg Daily       Metformin 1000 mg BID  Current Orders: Levemir 25 units QPM      Novolog Moderate Correction Scale/ SSI (0-15 units) TID AC + HS      Glipizide 15 mg Daily      Metformin 1000 mg BID     NPO today for I&D and possible Wound Vac Placement.  Current A1c of 10.5% shows poor glucose control at home.  PCP: Dr. Ian MalkinMaurice Roberts, [place], [name].     MD- Please consider the following in-hospital insulin adjustments as CBGs remain elevated in hospital:  1. Increase Levemir to 30 units QPM (20% increase)  2. Start Novolog Meal Coverage once patient resumes PO diet today after surgery:  Novolog 6 units TID with meals  (Please add the following Hold Parameters: Hold if pt eats <50% of meal, Hold if pt NPO)  3. Patient may benefit from the addition of a Novolog SSI regimen at home to help  better control CBGs post-op and promote healing at home      --Will follow patient during hospitalization--  Ambrose FinlandJeannine Johnston Christian Treadway RN, MSN, CDE Diabetes Coordinator Inpatient Glycemic Control Team Team Pager: [phone] (8a-5p)

## 2018-09-09 NOTE — Progress Notes (Signed)
Patient CBG 418 on glucometer, midlevel aware, stat glucose in progress.

## 2018-09-09 NOTE — Brief Op Note (Signed)
09/09/2018  4:21 PM  PATIENT:  Evan Moore  47 y.o. male  PRE-OPERATIVE DIAGNOSIS:  ulceration right foot  POST-OPERATIVE DIAGNOSIS:  ulceration right foot  PROCEDURE:  Procedure(s): INCISION AND DRAINAGE RIGHT FOOT ULCER (Right) DEBRIDEMENT OF NONVIABLE TISSUE RIGHT FOOT (Right)  SURGEON:  Surgeon(s) and Role:    * Tyson Babinski, DPM - Primary  PHYSICIAN ASSISTANT: none  ASSISTANTS: none   ANESTHESIA:   local and MAC  EBL:  Minimal 5cc.    BLOOD ADMINISTERED:none  DRAINS: none   LOCAL MEDICATIONS USED:  MARCAINE   , LIDOCAINE  and Amount: 20 ml  SPECIMEN:  Excision  DISPOSITION OF SPECIMEN:  PATHOLOGY  COUNTS:  YES  TOURNIQUET:   Total Tourniquet Time Documented: Calf (Right) - 21 minutes Total: Calf (Right) - 21 minutes   DICTATION: .Viviann Spare Dictation  PLAN OF CARE: Admit to inpatient   PATIENT DISPOSITION:  PACU - hemodynamically stable.   Delay start of Pharmacological VTE agent (>24hrs) due to surgical blood loss or risk of bleeding: not applicable

## 2018-09-09 NOTE — Progress Notes (Signed)
Inpatient Diabetes Program Recommendations  AACE/ADA: New Consensus Statement on Inpatient Glycemic Control (2015)  Target Ranges:  Prepandial:   less than 140 mg/dL      Peak postprandial:   less than 180 mg/dL (1-2 hours)      Critically ill patients:  140 - 180 mg/dL   Results for Evan Moore, Evan Moore (MRN [id number]) as of 09/09/2018 11:36  Ref. Range 09/07/2018 13:59  Hemoglobin A1C Latest Ref Range: 4.8 - 5.6 % 10.5 (H)     MD- Patient may benefit from the addition of a Novolog SSI regimen at home to help better control CBGs post-op and promote healing at home    NPO today for I&D and possible Wound Vac Placement.  Current A1c of 10.5% shows poor glucose control at home.  PCP: Dr. Ian MalkinMaurice Roberts, [name], [name].   Called patient by phone this AM to discuss current CBGs, current A1c level, home DM care regimen, etc.  DM Coordinator not present on [facility] today (working from [facility] in [place] today).  Spoke with patient about his current A1c of 10.5%.  Explained what an A1c is and what it measures.  Reminded patient that his goal A1c is 7% or less per ADA standards to prevent both acute and long-term complications.  Patient told me that when he was first diagnosed his A1c was >17% and he has tried to work hard to get it down.  Pt said he was encouraged that it is down to 10.5% and knows it needs to be lower for his health.  Explained to patient the extreme importance of good glucose control at home.  Encouraged patient to check his CBGs at least bid at home (fasting and another check within the day) and to record all CBGs in a logbook for his PCP to review.   Patient verified home meds with me over the phone: Levemir 25 units QPM Glipizide 15 mg Daily Metformin 1000 mg BID  Discussed with patient that the Attending MD may decide to send patient home on a Novolog SSi regimen temporarily to help better CBG control at home to help with wound healing (explained  to pt that the decision to send pt home on Novolog SSi would be the MD's decision and just wanted to make sure pt was comfortable with this in case this decision was made).  Explained what Novolog is and how a sliding scale regimen works.  Asked patient if he had any questions regarding his DM care at home.  Pt stated he had no questions for me at this time.    --Will follow patient during hospitalization--  Ambrose FinlandJeannine Johnston Aleksey Newbern RN, MSN, CDE Diabetes Coordinator Inpatient Glycemic Control Team Team Pager: [phone] (8a-5p)

## 2018-09-09 NOTE — Op Note (Signed)
09/09/2018  4:21 PM  PATIENT:  Evan Moore  47 y.o. male  PRE-OPERATIVE DIAGNOSIS:  ulceration right foot  POST-OPERATIVE DIAGNOSIS:  ulceration right foot  PROCEDURE:  Procedure(s): INCISION AND DRAINAGE RIGHT FOOT ULCER (Right) DEBRIDEMENT OF NONVIABLE TISSUE RIGHT FOOT (Right)  SURGEON:  Surgeon(s) and Role:    * Tyson Babinski, DPM - Primary  PHYSICIAN ASSISTANT: none  ASSISTANTS: none   ANESTHESIA:   local and MAC  EBL:  Minimal 5cc.    BLOOD ADMINISTERED:none  DRAINS: none   LOCAL MEDICATIONS USED:  MARCAINE   , LIDOCAINE  and Amount: 20 ml  SPECIMEN:  Excision  DISPOSITION OF SPECIMEN:  PATHOLOGY  COUNTS:  YES  TOURNIQUET:   Total Tourniquet Time Documented: Calf (Right) - 21 minutes Total: Calf (Right) - 21 minutes   .Patient was brought into the operating room laid supine on the operating table. Ankle tourniquet was applied to the surgical extremity. Following IV sedation, a local block was achieved using 10 cc of mixture of 1% plain lidocaine with 0.5% marcaine. The foot was the prepped, scrubbed and draped in aseptic manner. The right lower extremity was elevated for 3 minute and  the tourniquet on the surgical site was inflatted at 25mHG.   Attention was directed towards the right foot. There is full thickness ulcer noted at the right fifth MPJ lateral aspect. There is serosanguinous drainage noted. The ulcer is measured 1.7x1.2x0.5cm. Does not probe to bone. Using a skin blade, semi elliptical incision was made just over the ulcer. Ulcer was undermined and nonviable tissue was removed. Deep wound cultures were taken at this time. Pulse lavage was used to flush and wash the ulcer site. Wound was partially closed using 3-0 Nylon. Iodoform packing was used at the defect of the ulcer site. Another 10cc of mixture of 1% plain lidocaine with 0.5%marcaine was injected. Dry sterile dressing was applied. Tourniquet was deflated. Capillary refill time was  brisk to all lesser toes.   Patient will be transferred back to floor and discharge tomorrow.

## 2018-09-09 NOTE — Transfer of Care (Signed)
Immediate Anesthesia Transfer of Care Note  Patient: Evan Moore  Procedure(s) Performed: INCISION AND DRAINAGE RIGHT FOOT ULCER (Right Foot) DEBRIDEMENT OF NONVIABLE TISSUE RIGHT FOOT (Right Foot)  Patient Location: PACU  Anesthesia Type:MAC  Level of Consciousness: awake, alert , oriented and patient cooperative  Airway & Oxygen Therapy: Patient Spontanous Breathing  Post-op Assessment: Report given to RN and Post -op Vital signs reviewed and stable  Post vital signs: Reviewed and stable  Last Vitals:  Vitals Value Taken Time  BP    Temp    Pulse    Resp    SpO2      Last Pain:  Vitals:   09/09/18 1453  TempSrc: Oral  PainSc: 1          Complications: No apparent anesthesia complications

## 2018-09-10 ENCOUNTER — Encounter (HOSPITAL_COMMUNITY): Payer: Self-pay | Admitting: Podiatry

## 2018-09-10 LAB — GLUCOSE, CAPILLARY: GLUCOSE-CAPILLARY: 270 mg/dL — AB (ref 70–99)

## 2018-09-10 MED ORDER — LEVEMIR FLEXTOUCH 100 UNIT/ML ~~LOC~~ SOPN: 20.0000 [IU] | PEN_INJECTOR | Freq: Two times a day (BID) | SUBCUTANEOUS | 1 refills | Status: AC

## 2018-09-10 MED ORDER — CLINDAMYCIN HCL 300 MG PO CAPS: 300.0000 mg | ORAL_CAPSULE | Freq: Three times a day (TID) | ORAL | 0 refills | Status: AC

## 2018-09-10 MED ORDER — CIPROFLOXACIN HCL 500 MG PO TABS: 500.0000 mg | ORAL_TABLET | Freq: Two times a day (BID) | ORAL | 0 refills | Status: AC

## 2018-09-10 MED ORDER — INSULIN ASPART 100 UNIT/ML ~~LOC~~ SOLN
8.0000 [IU] | Freq: Once | SUBCUTANEOUS | Status: AC
  Administered 2018-09-10: 8 [IU] via SUBCUTANEOUS

## 2018-09-10 NOTE — Care Management (Signed)
Patient discharging home today. Wife has been instructed how to manage dressing/packing. Patient will follow up with [name]. No CM needs.

## 2018-09-10 NOTE — Progress Notes (Signed)
Reviewed d/c paperwork with patient and wife. Reviewed new medications and changes. Answered all questions. Wheeled stable patient and belongings to main entrance where he walked to the car with his wife. Patient d/cd home.

## 2018-09-10 NOTE — Progress Notes (Signed)
Podiatry progress note:   Patient is S/p Incision and drainage of the right foot ulcer with debridement of the ulcer. POV#1, POD#1. Patient denies any nausea, vomiting, fever or chills over night. Patient had no pain over night. He slept fine.   Physical exam: Surgical dressing is clean dry and intact.  General, alert, awake and oriented.  Vascular: DP pulse faintly palpable. PT pulse faintly palpable. Hair growth noted on the lower leg but diminished on the foot. Foot is warm to touch. Capillary refill time is <3 seconds to the right fifth digit.  Derm: There is three stiches where the ulcer was excised out at the right foot lateral aspect. Packing noted in the wound. Upon removal of packing no maldodor noted. Bleeding noted and minimal. No pain at the incision site. Peri wound redness is minimal.   Musculoskeletol: Muscle strength is 5/5.  Neurology: Protective sensation is diminished.   Surgical deep wound cultures are pending.  Surgical pathology is pending.   A: S/p Incision and drainage of right foot ulcer with debridement. DOS: 10/09/18.  Diabetes with neuropathy.   Plan: Patient examined and evaluated at bedside along with wife.  I changed the packing and showed his wife how to do packing. She has all the supply she needs to do dressing changes.  Packing will need to be change every other day.  Patient can be discharge home on ORAL clindamycin 300mg  TID for 7 days and Cipro 500mg  BID for 7 days.  Patient will be following up with me in the office on 09/17/18.  Patient to wear surgical shoe daily with limited activity.  I will follow up with surgical wound cultures and change the antibiotics as needed. Will follow up with surgical pathology.  Thank you for the consult.

## 2018-09-10 NOTE — Discharge Summary (Signed)
Physician Discharge Summary  Evan Moore UJW:[id number] DOB: Jan 18, 1971 DOA: 09/07/2018  PCP: [name], FNP  Admit date: 09/07/2018  Discharge date: 09/10/2018  Admitted From:Home  Disposition:  Home  Recommendations for Outpatient Follow-up:  1. Follow up with PCP in 1-2 weeks 2. Continue now on Levemir 20 units twice daily as patient has a very inconsistent eating pattern; refused to speak with diabetes educator and consider short acting insulin 3. Continue on clindamycin as well as ciprofloxacin for 7 more days as recommended by podiatry 4. Follow-up with Dr. Allena Katz podiatry on 09/17/2018 as scheduled  Home Health: None  Equipment/Devices: None  Discharge Condition: Stable  CODE STATUS: Full  Diet recommendation: Heart Healthy/Carb Modified  Brief/Interim Summary: Per HPI: Evan Pinteris a 47 y.o.malewith medical history significant forobesity, type 2 diabetes, peripheral neuropathy, and hypertension who has been dealing with a right foot wound on the lateral side over the last 2 months. He claims that this may have begun after he was bitten by a spider and has been seen by podiatry and was placed on a course of antibiotics for 14 days. Unfortunately, he is continued to have some ongoing trouble with the wound and was recently prescribed ciprofloxacin and clindamycin over the last 2 days and cultures were obtained in the office. Apparently, cultures returned today demonstrated the presence of MRSA and the patient was told to come to the hospital for IV antibiotic treatment until debridement could be performed on Monday. Patient denies any pain to his foot as he has severe neuropathy and states that he has had minimal clear drainage. He denies any fevers or chills. He does state that he has had an MRI about a week ago with no findings of osteomyelitis.  Patient was admitted for treatment on IV vancomycin and was seen by podiatry Dr. Allena Katz.  He underwent wound  debridement on 11/25.  He has tolerated the procedure well and is postop day #1 on day of discharge.  He will continue on clindamycin and ciprofloxacin as prescribed for 7 more days.  And follow-up with podiatry on 12/3 as noted.  He has continued to have significant blood glucose elevations during his inpatient stay with hemoglobin A1c noted to be 10.5%.  He has a very erratic eating pattern and refuses any short acting insulin or other means to help control his blood glucose.  His long-acting insulin will be increased to a total of 40 units daily from 25 units previously and he was put this twice a day for more even coverage.  Follow-up to PCP regarding blood glucose control.  No other acute events during the course of this admission.  Discharge Diagnoses:  Principal Problem:   Diabetic foot infection (HCC) Active Problems:   Essential hypertension   Type 2 diabetes mellitus without complication (HCC)   Right foot infection  Principal discharge diagnosis: Diabetic foot infection with MRSA status post debridement.  Discharge Instructions  Discharge Instructions    Diet - low sodium heart healthy   Complete by:  As directed    Increase activity slowly   Complete by:  As directed      Allergies as of 09/10/2018   No Known Allergies     Medication List    TAKE these medications   amitriptyline 10 MG tablet Commonly known as:  ELAVIL Take 10 mg by mouth every morning.   ammonium lactate 12 % cream Commonly known as:  AMLACTIN Apply 1 application topically daily as needed (APPLIED TO FEET).   ciprofloxacin  500 MG tablet Commonly known as:  CIPRO Take 1 tablet (500 mg total) by mouth 2 (two) times daily for 7 days. 5 DAY COURSE STARTING ON 09/05/2018   clindamycin 300 MG capsule Commonly known as:  CLEOCIN Take 1 capsule (300 mg total) by mouth 3 (three) times daily for 7 days. 7 DAY COURSE STATING ON 09/05/2018   escitalopram 10 MG tablet Commonly known as:  LEXAPRO Take 10  mg by mouth every morning.   gabapentin 100 MG capsule Commonly known as:  NEURONTIN Take 100-200 mg by mouth See admin instructions. 200mg  in the morning and 100mg  at bedtime   glipiZIDE 10 MG tablet Commonly known as:  GLUCOTROL Take 1 tablet by mouth every morning.   glipiZIDE 5 MG 24 hr tablet Commonly known as:  GLUCOTROL XL Take 1 tablet by mouth every morning.   LEVEMIR FLEXTOUCH 100 UNIT/ML Pen Generic drug:  Insulin Detemir Inject 20 Units into the skin 2 (two) times daily. What changed:    how much to take  when to take this   lisinopril-hydrochlorothiazide 20-25 MG tablet Commonly known as:  PRINZIDE,ZESTORETIC Take 1 tablet by mouth every morning.   meloxicam 15 MG tablet Commonly known as:  MOBIC Take 1 tablet by mouth every morning.   metFORMIN 1000 MG tablet Commonly known as:  GLUCOPHAGE Take 1 tablet by mouth 2 (two) times daily.   niacin 100 MG tablet Take 100 mg by mouth every morning.   rOPINIRole 0.5 MG tablet Commonly known as:  REQUIP Take 1 tablet by mouth at bedtime.   vitamin B-12 1000 MCG tablet Commonly known as:  CYANOCOBALAMIN Take 1,000 mcg by mouth every morning.   Vitamin D (Ergocalciferol) 1.25 MG (50000 UT) Caps capsule Commonly known as:  DRISDOL Take 50,000 Units by mouth every Thursday.   VOLTAREN 1 % Gel Generic drug:  diclofenac sodium Apply 1 application topically 3 (three) times daily as needed (FOR FEET).      Follow-up Information    [name], FNP Follow up in 1 week(s).   Specialty:  Family Medicine Contact information: [address] [place] [address] [phone]          No Known Allergies  Consultations:  Podiatry   Procedures/Studies: US Arterial Seg Multiple Le (abi, Segmental Pressures, Pvr's)  Result Date: 09/09/2018 CLINICAL DATA:  Small wound on the right lateral foot. EXAM: NONINVASIVE PHYSIOLOGIC VASCULAR STUDY OF BILATERAL LOWER EXTREMITIES TECHNIQUE: Non-invasive  vascular evaluation of both lower extremities was performed at rest, including calculation of ankle-brachial indices, multiple segmental pressure evaluation, segmental Doppler and segmental pulse volume recording. COMPARISON:  None. FINDINGS: Right Lower Extremity Resting ABI:  1.65 Segmental Pressures: Normal segmental pressures, no significant (20 mmHg) pressure gradient between adjacent segments. Great toe pressure: 141 Arterial Waveforms: Normal tri-phasic arterial waveforms. PVRs: Normal PVRs with maintained waveform amplitude, augmentation and quality. Left Lower Extremity: Resting ABI: Could not be calculated due to incompressible vessels. Segmental Pressures: Normal segmental pressures, no significant (20 mmHg) pressure gradient between adjacent segments. Great toe pressure: 147 Arterial Waveforms: Triphasic and biphasic Doppler waveforms in the left lower extremity. PVRs: PVR waveforms are slightly dampened compared to the right side. Digital waveforms are markedly dampened compared to the right side. Other: Symmetric upper extremity pressures. Ankle Brachial index > 1.4 Non diagnostic secondary to incompressible vessel calcifications 1.0-1.4       Normal 0.9-0.99     Borderline PAD 0.8-0.89     Mild PAD 0.5-0.79  Moderate PAD < 0.5          Severe PAD Toe Brachial Index Normal     >0.65 Moderate  0.53-0.64 Severe     <0.23 Toe Pressures Absolute toe pressure >6855mmHg sufficient for wound healing. Toe pressures <5550mmHg = critical limb ischemia. IMPRESSION: Right ABI is elevated measuring 1.65. Findings are suggestive for heavily calcified vessels and limits evaluation for peripheral vascular disease. No significant occlusive disease in the right lower extremity based on the waveforms. Left ABI could not be obtained due to incompressible vessels. No significant occlusive disease in left lower extremity based on waveforms but the left digital waveforms are dampened compared to the right. Electronically  Signed   By: Richarda OverlieAdam  Henn M.D.   On: 09/09/2018 15:38   Dg Foot Complete Right  Result Date: 09/07/2018 CLINICAL DATA:  Nonhealing foot ulcer. EXAM: RIGHT FOOT COMPLETE - 3+ VIEW COMPARISON:  None. FINDINGS: Lateral forefoot soft tissue swelling and cavitation. No opaque foreign body, soft tissue emphysema, or erosion. Diffuse arterial calcification. IMPRESSION: Lateral forefoot ulcer without evident osteomyelitis or opaque foreign body. Electronically Signed   By: Marnee SpringJonathon  Watts M.D.   On: 09/07/2018 15:44     Discharge Exam: Vitals:   09/09/18 2246 09/10/18 0633  BP: (!) 149/81 (!) 145/81  Pulse: 79 87  Resp: 17 18  Temp: 98 F (36.7 C) 98.3 F (36.8 C)  SpO2: 95% 97%   Vitals:   09/09/18 1654 09/09/18 1902 09/09/18 2246 09/10/18 0633  BP: 121/73 118/73 (!) 149/81 (!) 145/81  Pulse: 63 81 79 87  Resp: 16 20 17 18   Temp: 97.8 F (36.6 C) 98.1 F (36.7 C) 98 F (36.7 C) 98.3 F (36.8 C)  TempSrc: Oral Oral Oral Oral  SpO2: 95% 97% 95% 97%  Weight:      Height:        General: Pt is alert, awake, not in acute distress; obese Cardiovascular: RRR, S1/S2 +, no rubs, no gallops Respiratory: CTA bilaterally, no wheezing, no rhonchi Abdominal: Soft, NT, ND, bowel sounds + Extremities: no edema, no cyanosis    The results of significant diagnostics from this hospitalization (including imaging, microbiology, ancillary and laboratory) are listed below for reference.     Microbiology: Recent Results (from the past 240 hour(s))  Culture, blood (routine x 2)     Status: None (Preliminary result)   Collection Time: 09/07/18  2:02 PM  Result Value Ref Range Status   Specimen Description BLOOD LEFT ARM  Final   Special Requests   Final    BOTTLES DRAWN AEROBIC AND ANAEROBIC Blood Culture adequate volume   Culture   Final    NO GROWTH 3 DAYS Performed at [facility], [address] [place], [place] [address]    Report Status PENDING  Incomplete  Culture, blood (routine x 2)      Status: None (Preliminary result)   Collection Time: 09/07/18  2:05 PM  Result Value Ref Range Status   Specimen Description BLOOD LEFT HAND  Final   Special Requests   Final    BOTTLES DRAWN AEROBIC AND ANAEROBIC Blood Culture adequate volume   Culture   Final    NO GROWTH 3 DAYS Performed at [facility], [address] [place], [place] [address]    Report Status PENDING  Incomplete     Labs: BNP (last 3 results) No results for input(s): BNP in the last 8760 hours. Basic Metabolic Panel: Recent Labs  Lab 09/07/18 1359 09/08/18 0610 09/09/18  0542 09/09/18 2318  NA 133* 135 134*  --   K 3.8 4.0 4.2  --   CL 97* 100 100  --   CO2 26 29 26   --   GLUCOSE 395* 268* 355* 413*  BUN 16 22* 21*  --   CREATININE 1.33* 1.21 1.35*  --   CALCIUM 8.7* 9.0 9.0  --    Liver Function Tests: No results for input(s): AST, ALT, ALKPHOS, BILITOT, PROT, ALBUMIN in the last 168 hours. No results for input(s): LIPASE, AMYLASE in the last 168 hours. No results for input(s): AMMONIA in the last 168 hours. CBC: Recent Labs  Lab 09/07/18 1359 09/08/18 0610 09/09/18 0542  WBC 7.7 6.9 6.8  NEUTROABS 4.9  --   --   HGB 13.8 13.4 13.0  HCT 41.7 40.2 39.6  MCV 88.7 91.6 90.6  PLT 388 336 329   Cardiac Enzymes: No results for input(s): CKTOTAL, CKMB, CKMBINDEX, TROPONINI in the last 168 hours. BNP: Invalid input(s): POCBNP CBG: Recent Labs  Lab 09/09/18 1131 09/09/18 1447 09/09/18 1617 09/09/18 2247 09/10/18 0738  GLUCAP 191* 149* 126* 418* 270*   D-Dimer No results for input(s): DDIMER in the last 72 hours. Hgb A1c Recent Labs    09/07/18 1359  HGBA1C 10.5*   Lipid Profile No results for input(s): CHOL, HDL, LDLCALC, TRIG, CHOLHDL, LDLDIRECT in the last 72 hours. Thyroid function studies No results for input(s): TSH, T4TOTAL, T3FREE, THYROIDAB in the last 72 hours.  Invalid input(s): FREET3 Anemia work up No results for input(s): VITAMINB12, FOLATE, FERRITIN, TIBC,  IRON, RETICCTPCT in the last 72 hours. Urinalysis No results found for: COLORURINE, APPEARANCEUR, LABSPEC, PHURINE, GLUCOSEU, HGBUR, BILIRUBINUR, KETONESUR, PROTEINUR, UROBILINOGEN, NITRITE, LEUKOCYTESUR Sepsis Labs Invalid input(s): PROCALCITONIN,  WBC,  LACTICIDVEN Microbiology Recent Results (from the past 240 hour(s))  Culture, blood (routine x 2)     Status: None (Preliminary result)   Collection Time: 09/07/18  2:02 PM  Result Value Ref Range Status   Specimen Description BLOOD LEFT ARM  Final   Special Requests   Final    BOTTLES DRAWN AEROBIC AND ANAEROBIC Blood Culture adequate volume   Culture   Final    NO GROWTH 3 DAYS Performed at [facility], [address], [place], [place] [address]    Report Status PENDING  Incomplete  Culture, blood (routine x 2)     Status: None (Preliminary result)   Collection Time: 09/07/18  2:05 PM  Result Value Ref Range Status   Specimen Description BLOOD LEFT HAND  Final   Special Requests   Final    BOTTLES DRAWN AEROBIC AND ANAEROBIC Blood Culture adequate volume   Culture   Final    NO GROWTH 3 DAYS Performed at [facility], [address], [place], [place] [address]    Report Status PENDING  Incomplete     Time coordinating discharge: 35 minutes  SIGNED:   Erick Blinks, DO [facility] 09/10/2018, 10:20 AM Pager [phone]  If 7PM-7AM, please contact night-coverage [email or web address] Password [email or web address]

## 2018-09-10 NOTE — Progress Notes (Signed)
Patient lost IV access, refusing new IV. MD aware.

## 2018-09-10 NOTE — Progress Notes (Signed)
Inpatient Diabetes Program Recommendations  AACE/ADA: New Consensus Statement on Inpatient Glycemic Control (2015)  Target Ranges:  Prepandial:   less than 140 mg/dL      Peak postprandial:   less than 180 mg/dL (1-2 hours)      Critically ill patients:  140 - 180 mg/dL   Results for Monna FamINTER, Montario (MRN [id number]) as of 09/10/2018 07:43  Ref. Range 09/09/2018 07:56 09/09/2018 11:31 09/09/2018 14:47 09/09/2018 16:17 09/09/2018 22:47  Glucose-Capillary Latest Ref Range: 70 - 99 mg/dL 782268 (H)  8 units NOVOLOG  191 (H)  4 units NOVOLOG  149 (H) 126 (H) 418 (H)  8 units NOVOLOG    Results for Monna FamINTER, Quintavis (MRN [id number]) as of 09/10/2018 07:43  Ref. Range 09/10/2018 07:38  Glucose-Capillary Latest Ref Range: 70 - 99 mg/dL [id number] (H)    Admit with: R foot lateral diabetic foot infection with MRSA and failed outpatient treatment  History: DM  Home DM Meds: Levemir 25 units QPM                             Glipizide 15 mg Daily                             Metformin 1000 mg BID  Current Orders: Levemir 30 units QPM                            Novolog Resistant Correction Scale/ SSI (0-20 units) TID AC + HS      Novolog 6 units TID with meals                            Glipizide 15 mg Daily                            Metformin 1000 mg BID       MD- Please note that Levemir Insulin was HELD last PM.  Per MAR, RN documented "Pt not eating.  States he does not have any appetite".    CBG elevated this AM likely due to missing Levemir dose last PM (CBG 270 mg/dl this AM)  May want to give Levemir dose early today since patient did NOT get dose last PM.    --Will follow patient during hospitalization--  Ambrose FinlandJeannine Johnston [place] RN, MSN, CDE Diabetes Coordinator Inpatient Glycemic Control Team Team Pager: [phone] (8a-5p)

## 2018-09-12 LAB — CULTURE, BLOOD (ROUTINE X 2)
CULTURE: NO GROWTH
Culture: NO GROWTH
SPECIAL REQUESTS: ADEQUATE
Special Requests: ADEQUATE

## 2018-09-15 LAB — AEROBIC/ANAEROBIC CULTURE (SURGICAL/DEEP WOUND)

## 2018-09-15 LAB — AEROBIC/ANAEROBIC CULTURE W GRAM STAIN (SURGICAL/DEEP WOUND)

## 2018-10-15 ENCOUNTER — Emergency Department (HOSPITAL_COMMUNITY): Payer: Medicaid - Out of State

## 2018-10-15 ENCOUNTER — Other Ambulatory Visit: Payer: Self-pay

## 2018-10-15 ENCOUNTER — Inpatient Hospital Stay (HOSPITAL_COMMUNITY)
Admission: EM | Admit: 2018-10-15 | Discharge: 2018-10-21 | DRG: 617 | Discharge status: Against Medical Advice | Payer: Medicaid - Out of State | Attending: Internal Medicine | Admitting: Internal Medicine

## 2018-10-15 ENCOUNTER — Encounter (HOSPITAL_COMMUNITY): Payer: Self-pay

## 2018-10-15 DIAGNOSIS — E871 Hypo-osmolality and hyponatremia: Secondary | ICD-10-CM | POA: Diagnosis present

## 2018-10-15 DIAGNOSIS — B9561 Methicillin susceptible Staphylococcus aureus infection as the cause of diseases classified elsewhere: Secondary | ICD-10-CM | POA: Diagnosis present

## 2018-10-15 DIAGNOSIS — E1165 Type 2 diabetes mellitus with hyperglycemia: Secondary | ICD-10-CM | POA: Diagnosis present

## 2018-10-15 DIAGNOSIS — I739 Peripheral vascular disease, unspecified: Secondary | ICD-10-CM | POA: Diagnosis present

## 2018-10-15 DIAGNOSIS — L089 Local infection of the skin and subcutaneous tissue, unspecified: Secondary | ICD-10-CM | POA: Diagnosis not present

## 2018-10-15 DIAGNOSIS — Z7984 Long term (current) use of oral hypoglycemic drugs: Secondary | ICD-10-CM

## 2018-10-15 DIAGNOSIS — E11621 Type 2 diabetes mellitus with foot ulcer: Secondary | ICD-10-CM | POA: Diagnosis present

## 2018-10-15 DIAGNOSIS — L03115 Cellulitis of right lower limb: Secondary | ICD-10-CM | POA: Diagnosis present

## 2018-10-15 DIAGNOSIS — Z791 Long term (current) use of non-steroidal anti-inflammatories (NSAID): Secondary | ICD-10-CM

## 2018-10-15 DIAGNOSIS — M86171 Other acute osteomyelitis, right ankle and foot: Secondary | ICD-10-CM | POA: Diagnosis present

## 2018-10-15 DIAGNOSIS — M329 Systemic lupus erythematosus, unspecified: Secondary | ICD-10-CM | POA: Diagnosis present

## 2018-10-15 DIAGNOSIS — M869 Osteomyelitis, unspecified: Secondary | ICD-10-CM | POA: Diagnosis present

## 2018-10-15 DIAGNOSIS — E1140 Type 2 diabetes mellitus with diabetic neuropathy, unspecified: Secondary | ICD-10-CM | POA: Diagnosis present

## 2018-10-15 DIAGNOSIS — E1169 Type 2 diabetes mellitus with other specified complication: Secondary | ICD-10-CM | POA: Diagnosis present

## 2018-10-15 DIAGNOSIS — I119 Hypertensive heart disease without heart failure: Secondary | ICD-10-CM | POA: Diagnosis present

## 2018-10-15 DIAGNOSIS — Z91030 Bee allergy status: Secondary | ICD-10-CM | POA: Diagnosis not present

## 2018-10-15 DIAGNOSIS — L97519 Non-pressure chronic ulcer of other part of right foot with unspecified severity: Secondary | ICD-10-CM | POA: Diagnosis present

## 2018-10-15 DIAGNOSIS — Z95828 Presence of other vascular implants and grafts: Secondary | ICD-10-CM

## 2018-10-15 DIAGNOSIS — E11628 Type 2 diabetes mellitus with other skin complications: Secondary | ICD-10-CM | POA: Diagnosis present

## 2018-10-15 DIAGNOSIS — Z8614 Personal history of Methicillin resistant Staphylococcus aureus infection: Secondary | ICD-10-CM

## 2018-10-15 DIAGNOSIS — I10 Essential (primary) hypertension: Secondary | ICD-10-CM | POA: Diagnosis not present

## 2018-10-15 DIAGNOSIS — E1149 Type 2 diabetes mellitus with other diabetic neurological complication: Secondary | ICD-10-CM | POA: Diagnosis present

## 2018-10-15 DIAGNOSIS — E119 Type 2 diabetes mellitus without complications: Secondary | ICD-10-CM | POA: Diagnosis not present

## 2018-10-15 DIAGNOSIS — Z9889 Other specified postprocedural states: Secondary | ICD-10-CM

## 2018-10-15 DIAGNOSIS — Z794 Long term (current) use of insulin: Secondary | ICD-10-CM | POA: Diagnosis not present

## 2018-10-15 HISTORY — DX: Systemic lupus erythematosus, unspecified: M32.9

## 2018-10-15 LAB — CBC
HEMATOCRIT: 39.4 % (ref 39.0–52.0)
Hemoglobin: 13.3 g/dL (ref 13.0–17.0)
MCH: 29.7 pg (ref 26.0–34.0)
MCHC: 33.8 g/dL (ref 30.0–36.0)
MCV: 87.9 fL (ref 80.0–100.0)
Platelets: 430 10*3/uL — ABNORMAL HIGH (ref 150–400)
RBC: 4.48 MIL/uL (ref 4.22–5.81)
RDW: 11.9 % (ref 11.5–15.5)
WBC: 16.5 10*3/uL — AB (ref 4.0–10.5)
nRBC: 0 % (ref 0.0–0.2)

## 2018-10-15 LAB — BASIC METABOLIC PANEL
Anion gap: 10 (ref 5–15)
BUN: 25 mg/dL — ABNORMAL HIGH (ref 6–20)
CO2: 26 mmol/L (ref 22–32)
Calcium: 8.9 mg/dL (ref 8.9–10.3)
Chloride: 93 mmol/L — ABNORMAL LOW (ref 98–111)
Creatinine, Ser: 1.29 mg/dL — ABNORMAL HIGH (ref 0.61–1.24)
GFR calc non Af Amer: >60 mL/min (ref >60)
Glucose, Bld: 311 mg/dL — ABNORMAL HIGH (ref 70–99)
POTASSIUM: 4.4 mmol/L (ref 3.5–5.1)
SODIUM: 129 mmol/L — AB (ref 135–145)

## 2018-10-15 LAB — LACTIC ACID, PLASMA
LACTIC ACID, VENOUS: 1.2 mmol/L (ref 0.5–1.9)
LACTIC ACID, VENOUS: 1.1 mmol/L (ref 0.5–1.9)

## 2018-10-15 LAB — SEDIMENTATION RATE: SED RATE: 88 mm/h — AB (ref 0–16)

## 2018-10-15 LAB — C-REACTIVE PROTEIN: CRP: 8.7 mg/dL — AB (ref <1.0)

## 2018-10-15 MED ORDER — VANCOMYCIN HCL IN DEXTROSE 750-5 MG/150ML-% IV SOLN
750.0000 mg | Freq: Two times a day (BID) | INTRAVENOUS | Status: DC
  Administered 2018-10-16 (×2): 750 mg via INTRAVENOUS
  Filled 2018-10-15 (×5): qty 150

## 2018-10-15 MED ORDER — ACETAMINOPHEN 650 MG RE SUPP
650.0000 mg | Freq: Four times a day (QID) | RECTAL | Status: DC | PRN
  Administered 2018-10-16: 650 mg via RECTAL

## 2018-10-15 MED ORDER — VANCOMYCIN HCL 10 G IV SOLR
2000.0000 mg | Freq: Once | INTRAVENOUS | Status: AC
  Administered 2018-10-15: 2000 mg via INTRAVENOUS
  Filled 2018-10-15: qty 2000

## 2018-10-15 MED ORDER — ACETAMINOPHEN 325 MG PO TABS
650.0000 mg | ORAL_TABLET | Freq: Four times a day (QID) | ORAL | Status: DC | PRN
  Administered 2018-10-15 – 2018-10-18 (×6): 650 mg via ORAL
  Filled 2018-10-15 (×7): qty 2

## 2018-10-15 NOTE — H&P (Signed)
History and Physical    Evan Moore QIW:[id number] DOB: 1970/12/01 DOA: 10/15/2018  PCP: Arsenio Loader, FNP  Patient coming from: Home  I have personally briefly reviewed patient's old medical records in [place]  Chief Complaint: Right foot ulcer  HPI: Evan Moore is a 47 y.o. male with medical history significant of DM, Right foot ulcer who came to ER after being instructed by his podiatrist to go to ER.Pt came in from his podiatrist office for evaluation of a chronic wound on his right foot.   HPI dates back to  November when pt developed right foot wound , received po antibiotics then was admitted for surgical debridement of the wound.  Pt has been on a wound VAC and on antibiotics since then.  Pt then correlates his sickness to near Christmas  when he started getting sick again with fevers and malaise.  . Pt also says his wound started getting  more red . Pt also says his podiatrist noticed pus and asked him to come in  Pt c/o fever,says "I had fever earlier now ER gave me antibiotics, I feel good" No complaint of chest pain No Complain of shortness of breath No complaint of nausea, vomiting . No complaint of abdominal pain No complaint of any sick contacts NO c/o  seizure or syncope   ED Course: evaluated for right foot would started on IV vanco.   Review of Systems: As per HPI otherwise 10 point review of systems negative.    Past Medical History:  Diagnosis Date  . Diabetes mellitus without complication ([place])   . Hypertension   . Lupus ([place])   . MRSA (methicillin resistant staph aureus) culture positive     Past Surgical History:  Procedure Laterality Date  . APPENDECTOMY    . INCISION AND DRAINAGE Right 09/09/2018   Procedure: INCISION AND DRAINAGE RIGHT FOOT ULCER;  Surgeon: Tyson Babinski, DPM;  Location: [facility];  Service: Podiatry;  Laterality: Right;  . KNEE ARTHROSCOPY Left   . WOUND DEBRIDEMENT Right 09/09/2018   Procedure: DEBRIDEMENT  OF NONVIABLE TISSUE RIGHT FOOT;  Surgeon: Tyson Babinski, DPM;  Location: [facility];  Service: Podiatry;  Laterality: Right;    Social History:  reports that he has never smoked. He has never used smokeless tobacco. He reports previous alcohol use. He reports previous drug use.  Allergies  Allergen Reactions  . Bee Venom     No family history on file. Family history: Family history reviewed and not pertinent  Prior to Admission medications   Medication Sig Start Date End Date Taking? Authorizing Provider  ammonium lactate (AMLACTIN) 12 % cream Apply 1 application topically daily as needed (APPLIED TO FEET).  08/24/18  Yes [provider]  cephALEXin (KEFLEX) 500 MG capsule Take 500 mg by mouth 2 (two) times daily.   Yes [provider]  escitalopram (LEXAPRO) 10 MG tablet Take 10 mg by mouth every morning.  08/31/18  Yes [provider]  gabapentin (NEURONTIN) 100 MG capsule Take 100-200 mg by mouth See admin instructions. 237m in the morning and 1041mat bedtime 08/19/18  Yes [provider]  glipiZIDE (GLUCOTROL XL) 5 MG 24 hr tablet Take 1 tablet by mouth every morning.  08/20/18  Yes [provider]  glipiZIDE (GLUCOTROL) 10 MG tablet Take 1 tablet by mouth every morning.  08/07/18  Yes [provider]  LEVEMIR FLEXTOUCH 100 UNIT/ML Pen Inject 20 Units into the skin 2 (two) times daily. 09/10/18  Yes ShManuella Ghazi  Pratik D, DO  lisinopril-hydrochlorothiazide (PRINZIDE,ZESTORETIC) 20-25 MG tablet Take 1 tablet by mouth every morning.  08/30/18  Yes [provider]  meloxicam (MOBIC) 15 MG tablet Take 1 tablet by mouth every morning.  08/20/18  Yes [provider]  metFORMIN (GLUCOPHAGE) 1000 MG tablet Take 1 tablet by mouth 2 (two) times daily. 08/26/18  Yes [provider]  niacin 100 MG tablet Take 100 mg by mouth every morning.  08/30/18  Yes [provider]  rOPINIRole (REQUIP) 0.5 MG tablet Take 1  tablet by mouth at bedtime. 08/30/18  Yes [provider]  vitamin B-12 (CYANOCOBALAMIN) 1000 MCG tablet Take 1,000 mcg by mouth every morning.   Yes [provider]  Vitamin D, Ergocalciferol, (DRISDOL) 1.25 MG (50000 UT) CAPS capsule Take 50,000 Units by mouth every Thursday.  08/25/18  Yes [provider]  VOLTAREN 1 % GEL Apply 1 application topically 3 (three) times daily as needed (FOR FEET).  08/28/18  Yes [provider]  amitriptyline (ELAVIL) 10 MG tablet Take 10 mg by mouth daily.  08/31/18   [provider]    Physical Exam: Vitals:   10/15/18 1417 10/15/18 1418  BP: 135/75   Pulse: 89   Resp: 12   Temp: 99.4 F (37.4 C)   TempSrc: Oral   SpO2: 100%   Weight:  117.9 kg  Height:  _0  (1.626 m)    Constitutional: NAD, calm, comfortable Eyes: PERRL, lids and conjunctivae normal ENMT: Mucous membranes are moist. Posterior pharynx clear of any exudate or lesions.Normal dentition.  Neck: normal, supple, no masses, no thyromegaly Respiratory: clear to auscultation bilaterally, no wheezing, no crackles. Normal respiratory effort. No accessory muscle use.  Cardiovascular: Regular rate and rhythm, no murmurs / rubs / gallops. No extremity edema. 2+ pedal pulses. No carotid bruits.  Abdomen: no tenderness, no masses palpated. No hepatosplenomegaly. Bowel sounds positive.Obese  Musculoskeletal: no clubbing / cyanosis. No joint deformity upper and lower extremities. Good ROM, no contractures. Normal muscle tone.     Right foot , erythema + on lateral side Neurologic: CN 2-12 grossly intact. Sensation intact, DTR normal. Strength 5/5 in all 4.  Psychiatric: Normal judgment and insight. Alert and oriented x 3. Normal mood.    Labs on Admission: I have personally reviewed following labs and imaging studies  CBC: Recent Labs  Lab 10/15/18 1516  WBC 16.5*  HGB 13.3  HCT 39.4  MCV 87.9  PLT 976*   Basic Metabolic Panel: Recent  Labs  Lab 10/15/18 1516  NA 129*  K 4.4  CL 93*  CO2 26  GLUCOSE 311*  BUN 25*  CREATININE 1.29*  CALCIUM 8.9    Radiological Exams on Admission: Dg Foot Complete Right  Result Date: 10/15/2018 CLINICAL DATA:  Right lateral forefoot wound near the base of the fifth metatarsal. EXAM: RIGHT FOOT COMPLETE - 3+ VIEW COMPARISON:  Right foot x-rays dated September 07, 2018. FINDINGS: Prominent soft tissue defect and soft tissue swelling lateral to the fifth MTP joint with new erosive changes of the fifth metatarsal head. No subcutaneous emphysema. No acute fracture or dislocation. Joint spaces are preserved. Bone mineralization is normal. Vascular calcifications. IMPRESSION: 1. Large soft tissue ulceration along the lateral forefoot with new osteomyelitis of the fifth metatarsal head. Electronically Signed   By: Titus Dubin M.D.   On: 10/15/2018 18:00    EKG: Independently reviewed.   Assessment/Plan Active Problems:   Diabetic foot infection ([place])   Pt now with  osteomyelitis   XRay of foot shows osteo   MRI pending   ESR high   Pt on Vanco, will add cef   Pt will need podiatrist consult in am.      Essential hypertension    Stable    Type 2 diabetes mellitus without complication (HCC)    Stable    Hyponatremia   Most likely sec to hyperglycemia   Will follow      DVT prophylaxis: heparin Code Status: full code Family Communication: Pt spouse was present in the room  Disposition Plan: home Consults called: will need to call Podiatry( dr Posey Pronto) Admission status: inpt   Liana Gerold MD [facility] Pager [phone]  If 7PM-7AM, please contact night-coverage [email or web address] Password [facility]  10/15/2018, 7:16 PM

## 2018-10-15 NOTE — ED Triage Notes (Signed)
Pt has a wound on his foot from a spider bite on right foot. Had a surgery to clean out foot thoroughly before Thanksgiving. [name] would like pt to be admitted for IV antibiotics. States he may have cellulitis. Would also like MRI to rule out osteomyelitis. Pt's culture grew MRSA.

## 2018-10-15 NOTE — ED Provider Notes (Signed)
[facility]ENCY [facility]PARTMENT Provider Note   CSN: [id number] Arrival date & time: 10/15/18  1351     History   Chief Complaint Chief Complaint  Patient presents with  . Wound Check    HPI Evan Moore is a 47 y.o. male.  He is presenting from his podiatrist office for evaluation of a chronic wound on his right foot.  He was admitted late November for surgical debridement of the wound.  He has been on a wound VAC and on antibiotics since then.  Sounds like there is a period of a week or 2 where he is off antibiotics but then he started getting sick again with fevers and malaise.  He has been back on Keflex for a few weeks.  Sounds like everything looks like it was improving until Christmas and then it started getting more red and reportedly there was some signs of infection in the wound VAC.  [name] saw him today and told him to come here, that he would need to be admitted for an MRI and IV antibiotics and possible repeat surgery.  Patient has no pain in the area but he says he generally feels sick with body aches malaise and fatigue he thinks he has been having fevers.  His wife is been doing some packings in the wound itself.   The history is provided by the patient.  Wound Check  This is a chronic problem. Episode onset: late november. The problem has been gradually worsening. Pertinent negatives include no chest pain, no abdominal pain, no headaches and no shortness of breath. Nothing aggravates the symptoms. Nothing relieves the symptoms. The treatment provided no relief.    Past Medical History:  Diagnosis Date  . Diabetes mellitus without complication (HCC)   . Hypertension   . Lupus (HCC)   . MRSA (methicillin resistant staph aureus) culture positive     Patient Active Problem List   Diagnosis Date Noted  . Diabetic foot infection (HCC) 09/07/2018  . Essential hypertension 09/07/2018  . Type 2 diabetes mellitus without complication (HCC) 09/07/2018  . Right foot  infection 09/07/2018    Past Surgical History:  Procedure Laterality Date  . APPENDECTOMY    . INCISION AND DRAINAGE Right 09/09/2018   Procedure: INCISION AND DRAINAGE RIGHT FOOT ULCER;  Surgeon: Erskine EmeryPatel, Prayashkumar, DPM;  Location: [facility];  Service: Podiatry;  Laterality: Right;  . KNEE ARTHROSCOPY Left   . WOUND DEBRIDEMENT Right 09/09/2018   Procedure: DEBRIDEMENT OF NONVIABLE TISSUE RIGHT FOOT;  Surgeon: Erskine EmeryPatel, Prayashkumar, DPM;  Location: [facility];  Service: Podiatry;  Laterality: Right;        Home Medications    Prior to Admission medications   Medication Sig Start Date End Date Taking? Authorizing Provider  amitriptyline (ELAVIL) 10 MG tablet Take 10 mg by mouth every morning.  08/31/18   [provider]  ammonium lactate (AMLACTIN) 12 % cream Apply 1 application topically daily as needed (APPLIED TO FEET).  08/24/18   [provider]  escitalopram (LEXAPRO) 10 MG tablet Take 10 mg by mouth every morning.  08/31/18   [provider]  gabapentin (NEURONTIN) 100 MG capsule Take 100-200 mg by mouth See admin instructions. 200mg  in the morning and 100mg  at bedtime 08/19/18   [provider]  glipiZIDE (GLUCOTROL XL) 5 MG 24 hr tablet Take 1 tablet by mouth every morning.  08/20/18   [provider]  glipiZIDE (GLUCOTROL) 10 MG tablet Take 1 tablet by mouth every morning.  08/07/18   [provider]  LEVEMIR FLEXTOUCH 100 UNIT/ML Pen Inject 20 Units into the skin 2 (two) times daily. 09/10/18   Sherryll BurgerShah, Pratik D, DO  lisinopril-hydrochlorothiazide (PRINZIDE,ZESTORETIC) 20-25 MG tablet Take 1 tablet by mouth every morning.  08/30/18   [provider]  meloxicam (MOBIC) 15 MG tablet Take 1 tablet by mouth every morning.  08/20/18   [provider]  metFORMIN (GLUCOPHAGE) 1000 MG tablet Take 1 tablet by mouth 2 (two) times daily. 08/26/18   [provider]  niacin 100 MG tablet Take 100 mg by mouth every morning.   08/30/18   [provider]  rOPINIRole (REQUIP) 0.5 MG tablet Take 1 tablet by mouth at bedtime. 08/30/18   [provider]  vitamin B-12 (CYANOCOBALAMIN) 1000 MCG tablet Take 1,000 mcg by mouth every morning.    [provider]  Vitamin D, Ergocalciferol, (DRISDOL) 1.25 MG (50000 UT) CAPS capsule Take 50,000 Units by mouth every Thursday.  08/25/18   [provider]  VOLTAREN 1 % GEL Apply 1 application topically 3 (three) times daily as needed (FOR FEET).  08/28/18   [provider]    Family History No family history on file.  Social History Social History   Tobacco Use  . Smoking status: Never Smoker  . Smokeless tobacco: Never Used  Substance Use Topics  . Alcohol use: Not Currently  . Drug use: Not Currently     Allergies   Patient has no known allergies.   Review of Systems Review of Systems  Constitutional: Positive for chills, fatigue and fever.  HENT: Negative for sore throat.   Eyes: Negative for visual disturbance.  Respiratory: Negative for shortness of breath.   Cardiovascular: Negative for chest pain.  Gastrointestinal: Negative for abdominal pain.  Genitourinary: Negative for dysuria.  Musculoskeletal: Positive for gait problem.  Skin: Positive for wound. Negative for rash.  Neurological: Negative for headaches.     Physical Exam Updated Vital Signs BP 135/75 (BP Location: Left Arm)   Pulse 89   Temp 99.4 F (37.4 C) (Oral)   Resp 12   Ht 5\' 4"  (1.626 m)   Wt 117.9 kg   SpO2 100%   BMI 44.63 kg/m   Physical Exam Vitals signs and nursing note reviewed.  Constitutional:      Appearance: He is well-developed.  HENT:     Head: Normocephalic and atraumatic.  Eyes:     Conjunctiva/sclera: Conjunctivae normal.  Neck:     Musculoskeletal: Neck supple.  Cardiovascular:     Rate and Rhythm: Normal rate and regular rhythm.     Heart sounds: No murmur.  Pulmonary:     Effort: Pulmonary effort is  normal. No respiratory distress.     Breath sounds: Normal breath sounds.  Abdominal:     Palpations: Abdomen is soft.     Tenderness: There is no abdominal tenderness.  Musculoskeletal:        General: Swelling present. No tenderness.     Comments: His right foot is swollen and is got some purpleish discoloration over the dorsum of the foot.  There is a wound on the lateral aspect of the fifth metatarsal head.  He is also got some very dry skin and some cracking of the skin edges first and third toes.  There is no sign of any cellulitic changes from the ankle proximal  Skin:    General: Skin is warm and dry.     Capillary Refill: Capillary refill takes  less than 2 seconds.  Neurological:     General: No focal deficit present.     Mental Status: He is alert and oriented to person, place, and time.     Sensory: Sensory deficit present.     Motor: No weakness.      ED Treatments / Results  Labs (all labs ordered are listed, but only abnormal results are displayed) Labs Reviewed  CBC - Abnormal; Notable for the following components:      Result Value   WBC 16.5 (*)    Platelets 430 (*)    All other components within normal limits  BASIC METABOLIC PANEL - Abnormal; Notable for the following components:   Sodium 129 (*)    Chloride 93 (*)    Glucose, Bld 311 (*)    BUN 25 (*)    Creatinine, Ser 1.29 (*)    All other components within normal limits  SEDIMENTATION RATE - Abnormal; Notable for the following components:   Sed Rate 88 (*)    All other components within normal limits  CULTURE, BLOOD (ROUTINE X 2)  CULTURE, BLOOD (ROUTINE X 2)  LACTIC ACID, PLASMA  LACTIC ACID, PLASMA  C-REACTIVE PROTEIN    EKG None  Radiology Dg Foot Complete Right  Result Date: 10/15/2018 CLINICAL DATA:  Right lateral forefoot wound near the base of the fifth metatarsal. EXAM: RIGHT FOOT COMPLETE - 3+ VIEW COMPARISON:  Right foot x-rays dated September 07, 2018. FINDINGS: Prominent soft  tissue defect and soft tissue swelling lateral to the fifth MTP joint with new erosive changes of the fifth metatarsal head. No subcutaneous emphysema. No acute fracture or dislocation. Joint spaces are preserved. Bone mineralization is normal. Vascular calcifications. IMPRESSION: 1. Large soft tissue ulceration along the lateral forefoot with new osteomyelitis of the fifth metatarsal head. Electronically Signed   By: [name] M.D.   On: 10/15/2018 18:00    Procedures Procedures (including critical care time)  Medications Ordered in ED Medications  vancomycin (VANCOCIN) 2,000 mg in sodium chloride 0.9 % 500 mL IVPB (2,000 mg Intravenous New Bag/Given 10/15/18 1841)    Followed by  vancomycin (VANCOCIN) IVPB 750 mg/150 ml premix (has no administration in time range)     Initial Impression / Assessment and Plan / ED Course  I have reviewed the triage vital signs and the nursing notes.  Pertinent labs & imaging results that were available during my care of the patient were reviewed by me and considered in my medical decision making (see chart for details).  Clinical Course as of Oct 15 1858  Tue Oct 15, 2018  1805 Discussed with Dr. Wolfgang Phoenix from the hospitalist service who accepts the patient for admission.   [MB]    Clinical Course User Index [MB] Terrilee Files, MD     Final Clinical Impressions(s) / ED Diagnoses   Final diagnoses:  Cellulitis of right foot  Diabetic infection of right foot [facility])    ED Discharge Orders    None       Terrilee Files, MD 10/15/18 1900

## 2018-10-15 NOTE — Progress Notes (Addendum)
Pharmacy Antibiotic Note  Monna FamJoseph Glad is a 47 y.o. male admitted on 10/15/2018 with cellulitis.  Pharmacy has been consulted for Vancomycin dosing.  Plan: Vancomycin 2000mg  loading dose, then 750mg  IV every 12 hours.  Goal trough 15-20 mcg/mL.  F/u cxs and clinical progress Monitor V/S, labs and levels as indicated  Height: 5\' 4"  (162.6 cm) Weight: 260 lb (117.9 kg) IBW/kg (Calculated) : 59.2  Temp (24hrs), Avg:99.4 F (37.4 C), Min:99.4 F (37.4 C), Max:99.4 F (37.4 C)  Recent Labs  Lab 10/15/18 1516 10/15/18 1607  WBC 16.5*  --   CREATININE 1.29*  --   LATICACIDVEN  --  1.1    Normalized CrCl is 5273mls/min Estimated Creatinine Clearance: 84.6 mL/min (A) (by C-G formula based on SCr of 1.29 mg/dL (H)).    No Known Allergies  Antimicrobials this admission: Vancomycin 12/31>>    Dose adjustments this admission: N/A  Microbiology results: No cxs  Thank you for allowing pharmacy to be a part of this patient's care.  Elder CyphersLorie Christy Friede, BS Loura Backharm D, [place] Clinical Pharmacist Pager [phone] 10/15/2018 5:16 PM

## 2018-10-16 ENCOUNTER — Inpatient Hospital Stay (HOSPITAL_COMMUNITY): Payer: Medicaid - Out of State

## 2018-10-16 DIAGNOSIS — E11628 Type 2 diabetes mellitus with other skin complications: Secondary | ICD-10-CM

## 2018-10-16 DIAGNOSIS — M329 Systemic lupus erythematosus, unspecified: Secondary | ICD-10-CM | POA: Diagnosis present

## 2018-10-16 DIAGNOSIS — L089 Local infection of the skin and subcutaneous tissue, unspecified: Secondary | ICD-10-CM

## 2018-10-16 DIAGNOSIS — E871 Hypo-osmolality and hyponatremia: Secondary | ICD-10-CM

## 2018-10-16 DIAGNOSIS — M869 Osteomyelitis, unspecified: Secondary | ICD-10-CM

## 2018-10-16 DIAGNOSIS — E119 Type 2 diabetes mellitus without complications: Secondary | ICD-10-CM

## 2018-10-16 DIAGNOSIS — Z794 Long term (current) use of insulin: Secondary | ICD-10-CM

## 2018-10-16 DIAGNOSIS — I10 Essential (primary) hypertension: Secondary | ICD-10-CM

## 2018-10-16 LAB — CBC
HCT: 37.2 % — ABNORMAL LOW (ref 39.0–52.0)
Hemoglobin: 12.2 g/dL — ABNORMAL LOW (ref 13.0–17.0)
MCH: 29.1 pg (ref 26.0–34.0)
MCHC: 32.8 g/dL (ref 30.0–36.0)
MCV: 88.8 fL (ref 80.0–100.0)
Platelets: 407 10*3/uL — ABNORMAL HIGH (ref 150–400)
RBC: 4.19 MIL/uL — ABNORMAL LOW (ref 4.22–5.81)
RDW: 12.2 % (ref 11.5–15.5)
WBC: 11.7 10*3/uL — ABNORMAL HIGH (ref 4.0–10.5)
nRBC: 0 % (ref 0.0–0.2)

## 2018-10-16 LAB — GLUCOSE, CAPILLARY
Glucose-Capillary: 364 mg/dL — ABNORMAL HIGH (ref 70–99)
Glucose-Capillary: 179 mg/dL — ABNORMAL HIGH (ref 70–99)
Glucose-Capillary: 418 mg/dL — ABNORMAL HIGH (ref 70–99)

## 2018-10-16 LAB — COMPREHENSIVE METABOLIC PANEL
ALT: 16 U/L (ref 0–44)
AST: 12 U/L — ABNORMAL LOW (ref 15–41)
Albumin: 3.5 g/dL (ref 3.5–5.0)
Alkaline Phosphatase: 91 U/L (ref 38–126)
Anion gap: 9 (ref 5–15)
BUN: 27 mg/dL — ABNORMAL HIGH (ref 6–20)
CO2: 26 mmol/L (ref 22–32)
Calcium: 8.6 mg/dL — ABNORMAL LOW (ref 8.9–10.3)
Chloride: 95 mmol/L — ABNORMAL LOW (ref 98–111)
Creatinine, Ser: 1.48 mg/dL — ABNORMAL HIGH (ref 0.61–1.24)
GFR calc Af Amer: >60 mL/min (ref >60)
GFR calc non Af Amer: 56 mL/min — ABNORMAL LOW (ref >60)
Glucose, Bld: 403 mg/dL — ABNORMAL HIGH (ref 70–99)
Potassium: 4.1 mmol/L (ref 3.5–5.1)
Sodium: 130 mmol/L — ABNORMAL LOW (ref 135–145)
Total Bilirubin: 0.7 mg/dL (ref 0.3–1.2)
Total Protein: 8.1 g/dL (ref 6.5–8.1)

## 2018-10-16 LAB — C-REACTIVE PROTEIN: CRP: 10.6 mg/dL — ABNORMAL HIGH (ref <1.0)

## 2018-10-16 LAB — HEMOGLOBIN A1C
Hgb A1c MFr Bld: 11.4 % — ABNORMAL HIGH (ref 4.8–5.6)
Mean Plasma Glucose: 280.48 mg/dL

## 2018-10-16 LAB — CBG MONITORING, ED: Glucose-Capillary: 337 mg/dL — ABNORMAL HIGH (ref 70–99)

## 2018-10-16 LAB — GLUCOSE, RANDOM: Glucose, Bld: 439 mg/dL — ABNORMAL HIGH (ref 70–99)

## 2018-10-16 LAB — PREALBUMIN: Prealbumin: 18.3 mg/dL (ref 18–38)

## 2018-10-16 MED ORDER — INSULIN ASPART 100 UNIT/ML ~~LOC~~ SOLN: 0.0000 [IU] | Freq: Every day | SUBCUTANEOUS | Status: DC

## 2018-10-16 MED ORDER — INSULIN ASPART 100 UNIT/ML ~~LOC~~ SOLN
10.0000 [IU] | Freq: Three times a day (TID) | SUBCUTANEOUS | Status: DC
  Administered 2018-10-16 (×2): 10 [IU] via SUBCUTANEOUS

## 2018-10-16 MED ORDER — INSULIN ASPART 100 UNIT/ML ~~LOC~~ SOLN
10.0000 [IU] | Freq: Once | SUBCUTANEOUS | Status: AC
  Administered 2018-10-16: 10 [IU] via SUBCUTANEOUS

## 2018-10-16 MED ORDER — LISINOPRIL 10 MG PO TABS
20.0000 mg | ORAL_TABLET | Freq: Every day | ORAL | Status: DC
  Administered 2018-10-16 – 2018-10-21 (×6): 20 mg via ORAL
  Filled 2018-10-16 (×6): qty 2

## 2018-10-16 MED ORDER — ROPINIROLE HCL 0.25 MG PO TABS
0.5000 mg | ORAL_TABLET | Freq: Every day | ORAL | Status: DC
  Administered 2018-10-16 – 2018-10-20 (×5): 0.5 mg via ORAL
  Filled 2018-10-16 (×5): qty 2

## 2018-10-16 MED ORDER — TRAZODONE HCL 50 MG PO TABS: 25.0000 mg | ORAL_TABLET | Freq: Every evening | ORAL | Status: DC | PRN

## 2018-10-16 MED ORDER — GLIPIZIDE 10 MG PO TABS: 10.0000 mg | ORAL_TABLET | Freq: Every morning | ORAL | Status: DC

## 2018-10-16 MED ORDER — INSULIN ASPART 100 UNIT/ML ~~LOC~~ SOLN: 14.0000 [IU] | Freq: Three times a day (TID) | SUBCUTANEOUS | Status: DC

## 2018-10-16 MED ORDER — HYDROCHLOROTHIAZIDE 25 MG PO TABS
25.0000 mg | ORAL_TABLET | Freq: Every day | ORAL | Status: DC
  Administered 2018-10-16 – 2018-10-21 (×6): 25 mg via ORAL
  Filled 2018-10-16 (×6): qty 1

## 2018-10-16 MED ORDER — POLYETHYLENE GLYCOL 3350 17 G PO PACK: 17.0000 g | PACK | Freq: Every day | ORAL | Status: DC | PRN

## 2018-10-16 MED ORDER — GLIPIZIDE ER 5 MG PO TB24: 5.0000 mg | ORAL_TABLET | Freq: Every morning | ORAL | Status: DC

## 2018-10-16 MED ORDER — GABAPENTIN 100 MG PO CAPS: 100.0000 mg | ORAL_CAPSULE | ORAL | Status: DC

## 2018-10-16 MED ORDER — SODIUM CHLORIDE 0.9 % IV SOLN
INTRAVENOUS | Status: DC | PRN
  Administered 2018-10-16: 250 mL via INTRAVENOUS

## 2018-10-16 MED ORDER — AMITRIPTYLINE HCL 10 MG PO TABS
10.0000 mg | ORAL_TABLET | Freq: Every day | ORAL | Status: DC
  Administered 2018-10-16 – 2018-10-21 (×6): 10 mg via ORAL
  Filled 2018-10-16 (×6): qty 1

## 2018-10-16 MED ORDER — INSULIN DETEMIR 100 UNIT/ML ~~LOC~~ SOLN
20.0000 [IU] | Freq: Two times a day (BID) | SUBCUTANEOUS | Status: DC
  Administered 2018-10-16 – 2018-10-18 (×5): 20 [IU] via SUBCUTANEOUS
  Filled 2018-10-16 (×11): qty 0.2

## 2018-10-16 MED ORDER — SODIUM CHLORIDE 0.9 % IV SOLN
2.0000 g | INTRAVENOUS | Status: DC
  Administered 2018-10-16 – 2018-10-21 (×6): 2 g via INTRAVENOUS
  Filled 2018-10-16 (×3): qty 20
  Filled 2018-10-16: qty 2
  Filled 2018-10-16 (×2): qty 20
  Filled 2018-10-16 (×2): qty 2

## 2018-10-16 MED ORDER — INSULIN ASPART 100 UNIT/ML ~~LOC~~ SOLN: 0.0000 [IU] | Freq: Three times a day (TID) | SUBCUTANEOUS | Status: DC

## 2018-10-16 MED ORDER — INSULIN ASPART 100 UNIT/ML ~~LOC~~ SOLN
12.0000 [IU] | Freq: Three times a day (TID) | SUBCUTANEOUS | Status: DC
  Administered 2018-10-16 – 2018-10-17 (×4): 12 [IU] via SUBCUTANEOUS

## 2018-10-16 MED ORDER — METRONIDAZOLE 500 MG PO TABS
500.0000 mg | ORAL_TABLET | Freq: Three times a day (TID) | ORAL | Status: DC
  Administered 2018-10-16 – 2018-10-21 (×15): 500 mg via ORAL
  Filled 2018-10-16 (×15): qty 1

## 2018-10-16 MED ORDER — SODIUM CHLORIDE 0.9 % IV BOLUS
1000.0000 mL | Freq: Once | INTRAVENOUS | Status: AC
  Administered 2018-10-16: 1000 mL via INTRAVENOUS

## 2018-10-16 MED ORDER — VITAMIN B-12 1000 MCG PO TABS
1000.0000 ug | ORAL_TABLET | Freq: Every morning | ORAL | Status: DC
  Administered 2018-10-16 – 2018-10-21 (×6): 1000 ug via ORAL
  Filled 2018-10-16 (×6): qty 1

## 2018-10-16 MED ORDER — ESCITALOPRAM OXALATE 10 MG PO TABS
10.0000 mg | ORAL_TABLET | Freq: Every morning | ORAL | Status: DC
  Administered 2018-10-16 – 2018-10-21 (×6): 10 mg via ORAL
  Filled 2018-10-16 (×6): qty 1

## 2018-10-16 MED ORDER — LISINOPRIL-HYDROCHLOROTHIAZIDE 20-25 MG PO TABS: 1.0000 | ORAL_TABLET | Freq: Every morning | ORAL | Status: DC

## 2018-10-16 MED ORDER — AMMONIUM LACTATE 12 % EX LOTN
TOPICAL_LOTION | Freq: Every day | CUTANEOUS | Status: DC | PRN
  Filled 2018-10-16: qty 400

## 2018-10-16 MED ORDER — HYDROCODONE-ACETAMINOPHEN 5-325 MG PO TABS
1.0000 | ORAL_TABLET | ORAL | Status: DC | PRN
  Administered 2018-10-17: 1 via ORAL
  Administered 2018-10-19 (×2): 2 via ORAL
  Administered 2018-10-19: 1 via ORAL
  Administered 2018-10-19 – 2018-10-20 (×4): 2 via ORAL
  Filled 2018-10-16: qty 1
  Filled 2018-10-16 (×6): qty 2
  Filled 2018-10-16: qty 1
  Filled 2018-10-16: qty 2

## 2018-10-16 MED ORDER — INSULIN ASPART 100 UNIT/ML ~~LOC~~ SOLN
0.0000 [IU] | Freq: Every day | SUBCUTANEOUS | Status: DC
  Administered 2018-10-17 – 2018-10-18 (×2): 4 [IU] via SUBCUTANEOUS
  Administered 2018-10-19 – 2018-10-20 (×2): 3 [IU] via SUBCUTANEOUS

## 2018-10-16 MED ORDER — ONDANSETRON HCL 4 MG/2ML IJ SOLN: 4.0000 mg | Freq: Four times a day (QID) | INTRAMUSCULAR | Status: DC | PRN

## 2018-10-16 MED ORDER — HEPARIN SODIUM (PORCINE) 5000 UNIT/ML IJ SOLN
5000.0000 [IU] | Freq: Three times a day (TID) | INTRAMUSCULAR | Status: DC
  Administered 2018-10-16: 5000 [IU] via SUBCUTANEOUS
  Filled 2018-10-16: qty 1

## 2018-10-16 MED ORDER — ONDANSETRON HCL 4 MG PO TABS: 4.0000 mg | ORAL_TABLET | Freq: Four times a day (QID) | ORAL | Status: DC | PRN

## 2018-10-16 MED ORDER — INSULIN ASPART 100 UNIT/ML ~~LOC~~ SOLN
0.0000 [IU] | Freq: Three times a day (TID) | SUBCUTANEOUS | Status: DC
  Administered 2018-10-16: 20 [IU] via SUBCUTANEOUS
  Administered 2018-10-16: 4 [IU] via SUBCUTANEOUS
  Administered 2018-10-16: 15 [IU] via SUBCUTANEOUS
  Administered 2018-10-17: 11 [IU] via SUBCUTANEOUS
  Administered 2018-10-17 (×2): 4 [IU] via SUBCUTANEOUS
  Administered 2018-10-18: 3 [IU] via SUBCUTANEOUS
  Administered 2018-10-18: 4 [IU] via SUBCUTANEOUS
  Administered 2018-10-18: 7 [IU] via SUBCUTANEOUS
  Administered 2018-10-19: 4 [IU] via SUBCUTANEOUS
  Administered 2018-10-19: 15 [IU] via SUBCUTANEOUS
  Administered 2018-10-19: 3 [IU] via SUBCUTANEOUS
  Administered 2018-10-20: 11 [IU] via SUBCUTANEOUS
  Administered 2018-10-20: 4 [IU] via SUBCUTANEOUS
  Administered 2018-10-20: 7 [IU] via SUBCUTANEOUS
  Administered 2018-10-21: 4 [IU] via SUBCUTANEOUS
  Administered 2018-10-21: 7 [IU] via SUBCUTANEOUS

## 2018-10-16 NOTE — Consult Note (Signed)
Podiatry consult note.   Patient seen at bedside. His wife is present in the room today.   Patient has chronic ulceration of the right fifth toe and has been following up with me. Has had wound Vac treatment post Incision and drainage surgery. Patient was on clindamycin and cipro. Once the antibiotics stopped the infection restarted last week. It progressively got worse over the couple of days. Patient was sent to hospital by me for admission and iv antibiotics.   Past Medical History:  Diagnosis Date  . Diabetes mellitus without complication ([place])   . Hypertension   . Lupus ([place])   . MRSA (methicillin resistant staph aureus) culture positive         Patient Active Problem List   Diagnosis Date Noted  . Diabetic foot infection ([place]) 09/07/2018  . Essential hypertension 09/07/2018  . Type 2 diabetes mellitus without complication ([place]) 80/16/5537  . Right foot infection 09/07/2018         Past Surgical History:  Procedure Laterality Date  . APPENDECTOMY    . INCISION AND DRAINAGE Right 09/09/2018   Procedure: INCISION AND DRAINAGE RIGHT FOOT ULCER;  Surgeon: Tyson Babinski, DPM;  Location: [facility];  Service: Podiatry;  Laterality: Right;  . KNEE ARTHROSCOPY Left   . WOUND DEBRIDEMENT Right 09/09/2018   Procedure: DEBRIDEMENT OF NONVIABLE TISSUE RIGHT FOOT;  Surgeon: Tyson Babinski, DPM;  Location: [facility];  Service: Podiatry;  Laterality: Right;                  Home Medications                      Prior to Admission medications   Medication Sig Start Date End Date Taking? Authorizing Provider  amitriptyline (ELAVIL) 10 MG tablet Take 10 mg by mouth every morning.  08/31/18   [provider]  ammonium lactate (AMLACTIN) 12 % cream Apply 1 application topically daily as needed (APPLIED TO FEET).  08/24/18   [provider]  escitalopram (LEXAPRO) 10 MG tablet Take 10 mg by mouth every morning.  08/31/18   [provider]  gabapentin (NEURONTIN) 100 MG capsule Take 100-200 mg by mouth See admin instructions. 24m in the morning and 1057mat bedtime 08/19/18   [provider]  glipiZIDE (GLUCOTROL XL) 5 MG 24 hr tablet Take 1 tablet by mouth every morning.  08/20/18   [provider]  glipiZIDE (GLUCOTROL) 10 MG tablet Take 1 tablet by mouth every morning.  08/07/18   [provider]  LEVEMIR FLEXTOUCH 100 UNIT/ML Pen Inject 20 Units into the skin 2 (two) times daily. 09/10/18   ShManuella GhaziPratik D, DO  lisinopril-hydrochlorothiazide (PRINZIDE,ZESTORETIC) 20-25 MG tablet Take 1 tablet by mouth every morning.  08/30/18   [provider]  meloxicam (MOBIC) 15 MG tablet Take 1 tablet by mouth every morning.  08/20/18   [provider]  metFORMIN (GLUCOPHAGE) 1000 MG tablet Take 1 tablet by mouth 2 (two) times daily. 08/26/18   [provider]  niacin 100 MG tablet Take 100 mg by mouth every morning.  08/30/18   [provider]  rOPINIRole (REQUIP) 0.5 MG tablet Take 1 tablet by mouth at bedtime. 08/30/18   [provider]  vitamin B-12 (CYANOCOBALAMIN) 1000 MCG tablet Take 1,000 mcg by mouth every morning.    [provider]  Vitamin D, Ergocalciferol, (DRISDOL) 1.25 MG (50000 UT) CAPS capsule Take 50,000 Units by mouth every Thursday.  08/25/18  [provider]  VOLTAREN 1 % GEL Apply 1 application topically 3 (three) times daily as needed (FOR FEET).  08/28/18   [provider]    Family History No family history on file.  Social History Social History       Tobacco Use  . Smoking status: Never Smoker  . Smokeless tobacco: Never Used  Substance Use Topics  . Alcohol use: Not Currently  . Drug use: Not Currently     Allergies              Patient has no known allergies.   Review of Systems Review of Systems  Constitutional: Positive for chills, fatigue and fever.  HENT:  Negative for sore throat.   Eyes: Negative for visual disturbance.  Respiratory: Negative for shortness of breath.   Cardiovascular: Negative for chest pain.  Gastrointestinal: Negative for abdominal pain.  Genitourinary: Negative for dysuria.  Musculoskeletal: Positive for gait problem.  Skin: Positive for wound. Negative for rash.  Neurological: Negative for headaches.     Physical Exam Updated Vital Signs BP 135/75 (BP Location: Left Arm)   Pulse 89   Temp 99.4 F (37.4 C) (Oral)   Resp 12   Ht 5' 4"  (1.626 m)   Wt 117.9 kg   SpO2 100%   BMI 44.63 kg/m   Physical Exam Vitals signs and nursing note reviewed.  Constitutional:      Appearance: He is well-developed.  HENT:     Head: Normocephalic and atraumatic.  Eyes:     Conjunctiva/sclera: Conjunctivae normal.  Neck:     Musculoskeletal: Neck supple.  Cardiovascular:     Rate and Rhythm: Normal rate and regular rhythm.     Heart sounds: No murmur.  Pulmonary:     Effort: Pulmonary effort is normal. No respiratory distress.     Breath sounds: Normal breath sounds.  Abdominal:     Palpations: Abdomen is soft.     Tenderness: There is no abdominal tenderness.  Skin:    General: Skin is warm and dry.     Capillary Refill: Capillary refill takes less than 2 seconds.  Neurological:     General: No focal deficit present.     Mental Status: He is alert and oriented to person, place, and time.     Sensory: Sensory deficit present.     Motor: No weakness.    Podiatry physical exam:   Vascular: DP and PT pulses palpable. Capillary refill time is brisk to all lesser digits. Foot is warm to touch at the ulcer site.  Derm: Full thickness ulceration noted at the fifth MPJ right foot. Ulcer is measured to be 1.8x 1.3x0.8cm deep. Tunneling is present 6oclock about 3 cm deep.   Skin discoloration noted on the dorsal aspect of the foot with redness extending to midfoot. Ulcer has odor and drainage. Does probe to capsule  and nonviable tendon and tissue noted. Foot is warm to touch. There is also chronic fissure noted at the tip of the right hallux and right 2nd toe. There is chronic fissure noted at the right plantar heel as well.  Musculoskeletol: ulceration of the right fifth MPJ.  Neurology: Protective sensation is absent.   Labs: WBC trending down from 16.5 to 11.7,  ESR: 88 CRP: 8.7 A1C: pending. Last one was 10.7   Radiograph: EXAM: RIGHT FOOT COMPLETE - 3+ VIEW  COMPARISON:  Right foot x-rays dated September 07, 2018.  FINDINGS: Prominent soft tissue defect and soft tissue swelling lateral  to the fifth MTP joint with new erosive changes of the fifth metatarsal head. No subcutaneous emphysema. No acute fracture or dislocation. Joint spaces are preserved. Bone mineralization is normal. Vascular calcifications.  IMPRESSION: 1. Large soft tissue ulceration along the lateral forefoot with new osteomyelitis of the fifth metatarsal head.  MRI FINDINGs:  MRI OF THE RIGHT FOREFOOT WITHOUT CONTRAST  TECHNIQUE: Multiplanar, multisequence MR imaging of the right forefoot was performed. No intravenous contrast was administered.  COMPARISON:  Radiographs dated 10/05/2018  FINDINGS: Bones/Joint/Cartilage  There is abnormal signal throughout the fifth metatarsal. There is also focal cortical destruction of the lateral aspect of the distal fifth metatarsal adjacent to the soft tissue ulceration. The other bones of the foot demonstrate no significant abnormalities.  Muscles and Tendons  There is soft tissue edema around the tendons and within the muscles adjacent to the fifth metatarsal consistent with myositis. There is no discrete abscess.  Soft tissues  Deep soft tissue ulceration overlying the lateral aspect of the head of the fifth metatarsal.  IMPRESSION: Osteomyelitis of the fifth metatarsal with adjacent myositis and Cellulitis.    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  Uncontrolled diabetes mellitus with neuropathy.  New diagnosed of LUPUS by Dr.Shroff in [place].   Plan: Patient examined and evaluated at bedside by me. Patients wife is present in the room.   Dressing was removed and changed. I flushed the wound. Milked out some purulent drainage. Packed the wound with Iodoform packing.  Discussed surgery with patient and his wife. Plan is to remove the infected bone and nonviable tissue. Possible date of surgery is 10/17/2018 or 10/18/2018 by me.  I explained them the risk and benefits of the surgery. Explained them delayed healing, possible more surgery in future. No guarantees were given to the patient for the outcome of the procedure.   Patient will also need PICC line for 4 weeks of IV antibiotics after the surgery.  Patient also will need Wound Vac. He has wound vac at home already but will need nursing assistance for Vac change.  Please discontinue Heparin as patient moves around and low risk of DVT. He is not bed bound.  Continue IV vancomycin.  Continue the medical management as needed. I will follow up with patient until discharged home.

## 2018-10-16 NOTE — Progress Notes (Signed)
Pharmacy Antibiotic Note  Evan Moore is a 48 y.o. male admitted on 10/15/2018 with cellulitis.  Pharmacy has been consulted for Vancomycin dosing.  Plan: vancomycin trough 1/2@0500  Vancomycin 2000mg  loading dose, then 750mg  IV every 12 hours.  Goal trough 15-20 mcg/mL.  F/u cxs and clinical progress Monitor V/S, labs and levels as indicated   Height: 5\' 4"  (162.6 cm) Weight: 260 lb (117.9 kg) IBW/kg (Calculated) : 59.2  Temp (24hrs), Avg:98.3 F (36.8 C), Min:98.1 F (36.7 C), Max:98.5 F (36.9 C)  Recent Labs  Lab 10/15/18 1516 10/15/18 1607 10/16/18 0320  WBC 16.5*  --  11.7*  CREATININE 1.29*  --  1.48*  LATICACIDVEN 1.2 1.1  --     Normalized CrCl is 3973mls/min Estimated Creatinine Clearance: 73.7 mL/min (A) (by C-G formula based on SCr of 1.48 mg/dL (H)).    Allergies  Allergen Reactions  . Bee Venom     Antimicrobials this admission: Vancomycin 12/31>>    Dose adjustments this admission: N/A  Microbiology results: 12/31 UBx: NGTD   Thank you for allowing pharmacy to be a part of this patient's care.  Luan PullingGarrett Jaimie Pippins, PharmD, MBA, BCGP Clinical Pharmacist  10/16/2018 2:39 PM

## 2018-10-16 NOTE — Progress Notes (Signed)
PROGRESS NOTE  Evan Moore  ZWC:[id number]  DOB: December 01, 1970  DOA: 10/15/2018 PCP: [name], FNP   Brief Admission Hx: Evan Moore is a 48 y.o. male with medical history significant of DM, Right foot ulcer who came to ER after being instructed by his podiatrist to go to ER.Pt came in from his podiatrist office for evaluation of a chronicwound on his right foot.   MDM/Assessment & Plan:   1. Osteomyelitis of right foot -continue IV antibiotic therapy, arrange for PICC line placement for long-term IV antibiotics, podiatry consult requested. 2. 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 3. Essential hypertension-well-controlled, following. 4. Leukocytosis - WBC trending down with treatment.   DVT prophylaxis: Early ambulation Code Status: Full Family Communication: Patient updated at bedside Disposition Plan: Home when medically/surgically stabilized   Consultants:  Podiatry Allena Katz)  Procedures:  n/a  Antimicrobials:  Ceftriaxone 1/31  Vancomycin 1/31  Metronidazole 1/31   Subjective: Patient is without specific complaints this morning.  Objective: Vitals:   10/16/18 0500 10/16/18 0757 10/16/18 0854 10/16/18 1046  BP: 135/75 119/71 126/87 126/87  Pulse: 73 73 77   Resp:  19 16   Temp:  98.5 F (36.9 C) 98.1 F (36.7 C)   TempSrc:  Oral Oral   SpO2: 95% 96% 95%   Weight:      Height:        Intake/Output Summary (Last 24 hours) at 10/16/2018 1534 Last data filed at 10/16/2018 1000 Gross per 24 hour  Intake 490 ml  Output -  Net 490 ml   Filed Weights   10/15/18 1418  Weight: 117.9 kg     REVIEW OF SYSTEMS  As per history otherwise all reviewed and reported negative  Exam:  General exam: Awake, alert, no distress, cooperative. Respiratory system: Clear. No increased  work of breathing. Cardiovascular system: S1 & S2 heard, RRR. No JVD, murmurs, gallops, clicks or pedal edema. Gastrointestinal system: Abdomen is nondistended, soft and nontender. Normal bowel sounds heard. Central nervous system: Alert and oriented. No focal neurological deficits. Extremities: Insensate right foot with multiple open skin lesions, erythema noted on the right side. No purulent drainage seen.   Data Reviewed: Basic Metabolic Panel: Recent Labs  Lab 10/15/18 1516 10/16/18 0320  NA 129* 130*  K 4.4 4.1  CL 93* 95*  CO2 26 26  GLUCOSE 311* 403*  BUN 25* 27*  CREATININE 1.29* 1.48*  CALCIUM 8.9 8.6*   Liver Function Tests: Recent Labs  Lab 10/16/18 0320  AST 12*  ALT 16  ALKPHOS 91  BILITOT 0.7  PROT 8.1  ALBUMIN 3.5   No results for input(s): LIPASE, AMYLASE in the last 168 hours. No results for input(s): AMMONIA in the last 168 hours. CBC: Recent Labs  Lab 10/15/18 1516 10/16/18 0320  WBC 16.5* 11.7*  HGB 13.3 12.2*  HCT 39.4 37.2*  MCV 87.9 88.8  PLT 430* 407*   Cardiac Enzymes: No results for input(s): CKTOTAL, CKMB, CKMBINDEX, TROPONINI in the last 168 hours. CBG (last 3)  Recent Labs    10/16/18 0729 10/16/18 1113  GLUCAP 337* 364*   Recent Results (from the past 240 hour(s))  Culture, blood (routine x 2)     Status: None (Preliminary result)   Collection Time: 10/15/18  5:07 PM  Result Value Ref Range Status   Specimen Description BLOOD RIGHT ANTECUBITAL DRAWN  BY RN  Final   Special Requests   Final    BOTTLES DRAWN AEROBIC AND ANAEROBIC Blood Culture results may not be optimal due to an excessive volume of blood received in culture bottles   Culture   Final    NO GROWTH < 24 HOURS Performed at [facility], [address] [place], [place] [address]    Report Status PENDING  Incomplete  Culture, blood (routine x 2)     Status: None (Preliminary result)   Collection Time: 10/15/18  5:24 PM  Result Value Ref Range Status   Specimen  Description BLOOD LEFT ANTECUBITAL  Final   Special Requests   Final    BOTTLES DRAWN AEROBIC AND ANAEROBIC Blood Culture results may not be optimal due to an excessive volume of blood received in culture bottles   Culture   Final    NO GROWTH < 24 HOURS Performed at [facility], [address] [place], [place] [address]    Report Status PENDING  Incomplete     Studies: Mr Foot Right Wo Contrast  Result Date: 10/15/2018 CLINICAL DATA:  Right foot infection. Soft tissue wound from spider bite. EXAM: MRI OF THE RIGHT FOREFOOT WITHOUT CONTRAST TECHNIQUE: Multiplanar, multisequence MR imaging of the right forefoot was performed. No intravenous contrast was administered. COMPARISON:  Radiographs dated 10/05/2018 FINDINGS: Bones/Joint/Cartilage There is abnormal signal throughout the fifth metatarsal. There is also focal cortical destruction of the lateral aspect of the distal fifth metatarsal adjacent to the soft tissue ulceration. The other bones of the foot demonstrate no significant abnormalities. Muscles and Tendons There is soft tissue edema around the tendons and within the muscles adjacent to the fifth metatarsal consistent with myositis. There is no discrete abscess. Soft tissues Deep soft tissue ulceration overlying the lateral aspect of the head of the fifth metatarsal. IMPRESSION: Osteomyelitis of the fifth metatarsal with adjacent myositis and cellulitis. Electronically Signed   By: Francene BoyersJames  Maxwell M.D.   On: 10/15/2018 19:14   [place] Arterial Abi (screening Lower Extremity)  Result Date: 10/16/2018 CLINICAL DATA:  Peripheral arterial disease. Worsening right foot wound. EXAM: NONINVASIVE PHYSIOLOGIC VASCULAR STUDY OF BILATERAL LOWER EXTREMITIES TECHNIQUE: Evaluation of both lower extremities were performed at rest, including calculation of ankle-brachial indices with single level Doppler, pressure and pulse volume recording. COMPARISON:  09/09/2018 FINDINGS: Right ABI:  1.48 Left ABI:  1.54  Right Lower Extremity:  Normal arterial waveforms at the ankle. Left Lower Extremity:  Normal arterial waveforms at the ankle. 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 However, there continues to be normal waveforms at both ankles. No evidence for significant occlusive arterial disease. Electronically Signed   By: Richarda OverlieAdam  Henn M.D.   On: 10/16/2018 09:56   Dg Foot Complete Right  Result Date: 10/15/2018 CLINICAL DATA:  Right lateral forefoot wound near the base of the fifth metatarsal. EXAM: RIGHT FOOT COMPLETE - 3+ VIEW COMPARISON:  Right foot x-rays dated September 07, 2018. FINDINGS: Prominent soft tissue defect and soft tissue swelling lateral to the fifth MTP joint with new erosive changes of the fifth metatarsal head. No subcutaneous emphysema. No acute fracture or dislocation. Joint spaces are preserved. Bone mineralization is normal. Vascular calcifications. IMPRESSION: 1. Large soft tissue ulceration along the lateral forefoot with new osteomyelitis of the fifth metatarsal head. Electronically Signed   By: Obie DredgeWilliam T Derry M.D.   On: 10/15/2018 18:00  Scheduled Meds: . amitriptyline  10 mg Oral Daily  . escitalopram  10 mg Oral q morning - 10a  . gabapentin  100-200 mg Oral See admin instructions  . hydrochlorothiazide  25 mg Oral Daily  . insulin aspart  0-20 Units Subcutaneous TID WC  . insulin aspart  0-5 Units Subcutaneous QHS  . insulin aspart  14 Units Subcutaneous TID WC  . insulin detemir  20 Units Subcutaneous BID  . lisinopril  20 mg Oral Daily  . metroNIDAZOLE  500 mg Oral Q8H  . rOPINIRole  0.5 mg Oral QHS  . vitamin B-12  1,000 mcg Oral q morning - 10a   Continuous Infusions: . sodium chloride 250 mL (10/16/18 [id number])  . cefTRIAXone (ROCEPHIN)  IV Stopped (10/16/18 0342)  . vancomycin Stopped (10/16/18  0804)    Active Problems:   Diabetic foot infection ([facility])   Essential hypertension   Type 2 diabetes mellitus without complication ([facility])   Hyponatremia   Osteomyelitis ([facility])   Time spent:   Standley Dakinslanford Johnson, MD, FAAFP [facility] Pager [phone]  If 7PM-7AM, please contact night-coverage [email or web address] Password [email or web address] 10/16/2018, 3:34 PM    LOS: 1 day

## 2018-10-16 NOTE — Progress Notes (Signed)
Arrived to room to place PICC line.  Pt states he does not want a PICC at this time.  Risks and benefits reviewed with pt and S.O. at bedside.  Pt states he wants to do the hyperbaric chamber and not deal with PICC line and ABT.  Reviewed home life with a PICC and overall process to further inform them on what is involved once d/c home.  Pt continues to refuse.  Please reorder PICC if pt becomes agreeable to procedure.

## 2018-10-17 ENCOUNTER — Other Ambulatory Visit: Payer: Self-pay | Admitting: Podiatry

## 2018-10-17 LAB — CBC WITH DIFFERENTIAL/PLATELET
ABS IMMATURE GRANULOCYTES: 0.04 10*3/uL (ref 0.00–0.07)
Basophils Absolute: 0.1 10*3/uL (ref 0.0–0.1)
Basophils Relative: 1 %
Eosinophils Absolute: 0.4 10*3/uL (ref 0.0–0.5)
Eosinophils Relative: 4 %
HCT: 34.8 % — ABNORMAL LOW (ref 39.0–52.0)
Hemoglobin: 11.5 g/dL — ABNORMAL LOW (ref 13.0–17.0)
Immature Granulocytes: 0 %
Lymphocytes Relative: 19 %
Lymphs Abs: 1.7 10*3/uL (ref 0.7–4.0)
MCH: 29.4 pg (ref 26.0–34.0)
MCHC: 33 g/dL (ref 30.0–36.0)
MCV: 89 fL (ref 80.0–100.0)
Monocytes Absolute: 0.6 10*3/uL (ref 0.1–1.0)
Monocytes Relative: 7 %
Neutro Abs: 6.2 10*3/uL (ref 1.7–7.7)
Neutrophils Relative %: 69 %
Platelets: 384 10*3/uL (ref 150–400)
RBC: 3.91 MIL/uL — ABNORMAL LOW (ref 4.22–5.81)
RDW: 12 % (ref 11.5–15.5)
WBC: 9 10*3/uL (ref 4.0–10.5)
nRBC: 0 % (ref 0.0–0.2)

## 2018-10-17 LAB — COMPREHENSIVE METABOLIC PANEL
ALT: 15 U/L (ref 0–44)
AST: 11 U/L — AB (ref 15–41)
Albumin: 3 g/dL — ABNORMAL LOW (ref 3.5–5.0)
Alkaline Phosphatase: 81 U/L (ref 38–126)
Anion gap: 8 (ref 5–15)
BUN: 23 mg/dL — AB (ref 6–20)
CO2: 25 mmol/L (ref 22–32)
CREATININE: 1.07 mg/dL (ref 0.61–1.24)
Calcium: 8.4 mg/dL — ABNORMAL LOW (ref 8.9–10.3)
Chloride: 101 mmol/L (ref 98–111)
GFR calc Af Amer: >60 mL/min (ref >60)
GFR calc non Af Amer: >60 mL/min (ref >60)
Glucose, Bld: 213 mg/dL — ABNORMAL HIGH (ref 70–99)
Potassium: 3.6 mmol/L (ref 3.5–5.1)
Sodium: 134 mmol/L — ABNORMAL LOW (ref 135–145)
Total Bilirubin: 0.3 mg/dL (ref 0.3–1.2)
Total Protein: 7.3 g/dL (ref 6.5–8.1)

## 2018-10-17 LAB — GLUCOSE, CAPILLARY
Glucose-Capillary: 223 mg/dL — ABNORMAL HIGH (ref 70–99)
Glucose-Capillary: 192 mg/dL — ABNORMAL HIGH (ref 70–99)
Glucose-Capillary: 165 mg/dL — ABNORMAL HIGH (ref 70–99)
Glucose-Capillary: 260 mg/dL — ABNORMAL HIGH (ref 70–99)
Glucose-Capillary: 337 mg/dL — ABNORMAL HIGH (ref 70–99)

## 2018-10-17 LAB — VANCOMYCIN, TROUGH: Vancomycin Tr: 12 ug/mL — ABNORMAL LOW (ref 15–20)

## 2018-10-17 LAB — MRSA PCR SCREENING: MRSA by PCR: POSITIVE — AB

## 2018-10-17 LAB — MAGNESIUM: Magnesium: 2 mg/dL (ref 1.7–2.4)

## 2018-10-17 LAB — HIV ANTIBODY (ROUTINE TESTING W REFLEX): HIV Screen 4th Generation wRfx: NONREACTIVE

## 2018-10-17 MED ORDER — PRO-STAT SUGAR FREE PO LIQD
30.0000 mL | Freq: Two times a day (BID) | ORAL | Status: DC
  Filled 2018-10-17 (×5): qty 30

## 2018-10-17 MED ORDER — VANCOMYCIN HCL IN DEXTROSE 1-5 GM/200ML-% IV SOLN
1000.0000 mg | Freq: Two times a day (BID) | INTRAVENOUS | Status: DC
  Administered 2018-10-17 – 2018-10-21 (×10): 1000 mg via INTRAVENOUS
  Filled 2018-10-17 (×9): qty 200

## 2018-10-17 MED ORDER — ENSURE MAX PROTEIN PO LIQD
11.0000 [oz_av] | Freq: Every day | ORAL | Status: DC
  Administered 2018-10-17 – 2018-10-19 (×2): 11 [oz_av] via ORAL
  Filled 2018-10-17 (×6): qty 330

## 2018-10-17 MED ORDER — OCUVITE-LUTEIN PO CAPS
1.0000 | ORAL_CAPSULE | Freq: Every day | ORAL | Status: DC
  Administered 2018-10-17 – 2018-10-21 (×5): 1 via ORAL
  Filled 2018-10-17 (×5): qty 1

## 2018-10-17 MED ORDER — INSULIN ASPART 100 UNIT/ML ~~LOC~~ SOLN
14.0000 [IU] | Freq: Three times a day (TID) | SUBCUTANEOUS | Status: DC
  Administered 2018-10-18: 14 [IU] via SUBCUTANEOUS

## 2018-10-17 MED ORDER — LIVING WELL WITH DIABETES BOOK
Freq: Once | Status: AC
  Administered 2018-10-17: 18:00:00
  Filled 2018-10-17: qty 1

## 2018-10-17 NOTE — Progress Notes (Addendum)
Podiatry Progress note:   S: Patient seen at bedside today along with his wife. He is nervous about PICC line and going home with it. I explained him in details that IV antibiotics will help him lower the infection faster. I also explained him since its bone infection it would be ideal to have [facility] for IV antibiotics.  He is has agreed to PICC line. He is also wants me to remove the fifth toe and wants to me to try to close the wound. I explained him I cannot guarantee him whether I can close it completely due to active infection and purulent drainage. I explained him that wound can dehisce if I close it. It would be better to let it close via wound vac.   He has no complaint foot wise. Has no pain and is neuropathic.    Podiatry physical exam:   Vascular: DP and PT pulses palpable. Capillary refill time is brisk to all lesser digits. Foot is warm to touch at the ulcer site.  Derm: Full thickness ulceration noted at the fifth MPJ right foot. Ulcer is measured to be 1.8x 1.3x0.8cm deep. Tunneling is present 6oclock about 3 cm deep.   Skin discoloration noted on the dorsal aspect of the foot with redness extending to midfoot. Ulcer has odor and drainage. Does probe to capsule and nonviable tendon and tissue noted. Foot is warm to touch. There is also chronic fissure noted at the tip of the right hallux and right 2nd toe. There is chronic fissure noted at the right plantar heel as well.  Musculoskeletol: ulceration of the right fifth MPJ.  Neurology: Protective sensation is absent.    Labs: WBC trending down from 16.5 to 11.7 to 9.0  ESR: 88 CRP: 8.7 to 10.6 A1C: has increased from 10.5 (November 2019) to 11.4   Assessment:  Chronic ulceration of the right fifth toe with acute osteomyelitis and cellulitis.  Uncontrolled diabetes mellitus with neuropathy.  New diagnosed of LUPUS by Dr.Shroff in [place].   Plan: Patient examined and evaluated at bedside by me. Patients wife is present in  the room.   Dressing was removed and changed. I flushed the wound. Milked out some purulent drainage. Packed the wound with Iodoform packing.  Discussed surgery with patient and his wife. Date of surgery: 10/18/2018 at 2:00pm.  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  I explained them the risk and benefits of the surgery. Explained them delayed healing, possible more surgery in future. No guarantees were given to the patient for the outcome of the procedure.   Patient will also need PICC line for 4 weeks of IV antibiotics after the surgery. Patient has agreed again for the PICC line. Please reconsult the PICC.  Patient also will need Wound Vac. He has wound vac at home already but will need nursing assistance for Vac change. Continue IV vancomycin, Flagyl, Ceftriaxone.  Continue the medical management as needed. I will follow up with patient until discharged home.

## 2018-10-17 NOTE — Progress Notes (Signed)
Initial Nutrition Assessment  DOCUMENTATION CODES:   Morbid obesity  INTERVENTION:  Ensure Max daily; each supplement provides 150 kcals and 30 grams protein per serving Pro-stat BID; each supplement provides100 kcals and 15 grams protein per serving MVI   NUTRITION DIAGNOSIS:   Increased nutrient needs related to wound healing(diabetic foot infection) as evidenced by estimated needs.   GOAL:   Patient will meet greater than or equal to 90% of their needs   MONITOR:   Supplement acceptance, Skin, Weight trends, Labs, I & O's  REASON FOR ASSESSMENT:   Consult Wound healing  ASSESSMENT:  48 year old male with medical history significant for DM, rt foot ulcer with surgical debridement, wound VAC, Lupus, HTN, presented to ED with fever and evaluation of chronic rt foot wound per podiatrist instruction   Patient sitting on side of bed with back to the door at RD visit, family member present. Patient made limited eye contact and minimal interest of RD visit. Patient expressed his great dislike of the food although eating 100%. He reports 2 small chicken legs and eating most of the beans and then having diarrhea shortly after.   When at home patient eats 1-2 meals/day; he recalls never eating breakfast and lunch/dinner are mostly fast foods (chz/turkey subs, meatball subs, pasta, lasagna, burgers, pizza)  Patient stated that he weighed 460lbs from (06-08) d/t a back injury he sustained playing football. He was later diagnosed with DM and made efforts to lose weight through diet and exercise and had hopes for being taken off medications and cured from DM. He expressed his frustration for the wound on his foot and upcoming surgery.   RD suggested ensure max and patient made a face and said he did not like ONS, but agreed to chocolate during his stay.   11/25: AP OR - debridement of nonviable tissue; rt foot   - incision/drainage; rt foot ulcer  Medications reviewed and include:  novolog (0-20 units) with meals (0-5 units) at bedtime, (12 units) with meals, Levemir (20units) BID, lisinopril 20mg  daily, flagyl, B12, vancocin  Labs: Na 134 (L)  Glucose 213 (H) Corrected Ca for low albumin 8.6 (L) Lab Results  Component Value Date   HGBA1C 11.4 (H) 10/16/2018       NUTRITION - FOCUSED PHYSICAL EXAM:    Most Recent Value  Orbital Region  Mild depletion  Upper Arm Region  No depletion  Thoracic and Lumbar Region  No depletion  Buccal Region  No depletion  Temple Region  No depletion  Clavicle Bone Region  No depletion  Clavicle and Acromion Bone Region  No depletion  Scapular Bone Region  No depletion  Dorsal Hand  No depletion  Patellar Region  No depletion  Anterior Thigh Region  No depletion  Posterior Calf Region  No depletion  Edema (RD Assessment)  Mild [+1,  RLE]  Hair  Reviewed  Eyes  Reviewed  Mouth  Reviewed  Skin  Reviewed  Nails  Reviewed       Diet Order:  100% x 3 recorded meals Diet Order            Diet heart healthy/carb modified Room service appropriate? Yes; Fluid consistency: Thin  Diet effective now              EDUCATION NEEDS:   Not appropriate for education at this time  Skin:  Skin Assessment: Reviewed RN Assessment(open wound; diabetic ulcer; rt lateral foot)  Last BM:  10/15/2018  Height:  Ht Readings from Last 1 Encounters:  10/15/18 5\' 4"  (1.626 m)    Weight: 259lbs  Wt Readings from Last 1 Encounters:  10/15/18 117.9 kg    Ideal Body Weight:  59 kg130lbs  BMI:  Body mass index is 44.63 kg/m.  Estimated Nutritional Needs:   Kcal:  1610-9604 (MSJ 1.2-1.3)  Protein:  112-136g (1.9-2.3g/kg/IBW)  Fluid:  </=2.5L    Lars Masson, RD, LDN  After Hours/Weekend Pager: [phone]

## 2018-10-17 NOTE — Consult Note (Signed)
WOC Nurse wound consult note Reason for Consult: right foot 5th metatarsal head and soft tissue infection. Also noted is RGT and right foot, second digit chronic, nonhealing wounds and forefoot erythema, edema.  Wound with purulent drainage. Wound type: neuropathic, infectious Pressure Injury POA: N/A Measurement: Right foot great toe:  0.2cm x 1.5cm with depth unable to be determined due to the presence of dried serum (scab). Right foot, second toe: erythema and edema with faull thickness wound at nail measuring 0.4cm x 0.6cm with dried serum obscuring wound bed. Right lateral foot distal ulceration: 2cm x 2.5cm x 0.4cm drop-in depth with tunneling measuring 3cm toward posterior foot (and other proximal ulceration). Yellow/white thick drainage in a moderate amount with strong odor. Right lateral foot proximal ulceration: 2.2cm x 2.5cm x 0.2cm with dark purple/red discoloration, light yellow exudate. Wound bed: As described above Drainage (amount, consistency, odor) As described above Periwound:Eythematous, warm and edematous. Affected area measures 12cm x 14cm Dressing procedure/placement/frequency: Followed by podiatry and is for future surgery, possibly today or tomorrow. Right lateral foot wounds could be approaching communication. As noted above, podiatry is managing care, but I will provide Nursing with guidance via the Orders consistent with the care implemented emergently yesterday (filling defect with 1/2 inch gauge packing strip). If Podiatry orders are entered, they will supercede mine.  WOC nursing team will not follow, but will remain available to this patient, the nursing and medical teams.  Please re-consult if needed. Thanks, Ladona Mow, MSN, RN, GNP, [facility] [place]  Pager# [phone]

## 2018-10-17 NOTE — Progress Notes (Signed)
Inpatient Diabetes Program Recommendations  AACE/ADA: New Consensus Statement on Inpatient Glycemic Control (2015)  Target Ranges:  Prepandial:   less than 140 mg/dL      Peak postprandial:   less than 180 mg/dL (1-2 hours)      Critically ill patients:  140 - 180 mg/dL   Lab Results  Component Value Date   GLUCAP 192 (H) 10/17/2018   HGBA1C 11.4 (H) 10/16/2018    Review of Glycemic Control Results for Evan Moore, Evan Moore (MRN [id number]) as of 10/17/2018 11:35  Ref. Range 10/16/2018 11:13 10/16/2018 16:02 10/16/2018 22:03 10/17/2018 03:30 10/17/2018 07:51  Glucose-Capillary Latest Ref Range: 70 - 99 mg/dL 798 (H) 921 (H) 194 (H) 223 (H) 192 (H)   Diabetes history: DM2 Outpatient Diabetes medications: Levemir 20 units bid, metformin 1000 mg bid, glipizide 10 mg QAM and glipizide XL 5 mg QAM Current orders for Inpatient glycemic control: Levemir 20 units bid + Novolog 12 units tid + Novolog resistant correction scale tid + hs 0-5 units  Inpatient Diabetes Program Recommendations:   Spoke with pt about A1C results 11.4 (average blood glucose 280 over the past 2-3 months) and explained what an A1C is, basic pathophysiology of DM Type 2, basic home care, basic diabetes diet nutrition principles, importance of checking CBGs and maintaining good CBG control to prevent long-term and short-term complications. Reviewed signs and symptoms of hyperglycemia and hypoglycemia and how to treat hypoglycemia at home. Also reviewed blood sugar goals at home.  RNs to provide ongoing basic DM education at bedside with this patient. Have ordered educational booklet and DM videos.  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 Reviewed with patient need to decrease sugar and carbohydrates to maintain glycemic control Patient states he is taking his medications as prescribed.  Thank you, Billy Fischer. Januel Doolan, RN, MSN, CDE  Diabetes Coordinator Inpatient Glycemic Control  Team Team Pager [phone] (8am-5pm) 10/17/2018 12:08 PM

## 2018-10-17 NOTE — Progress Notes (Signed)
Received order for PICC.  Plan to insert tomorrow.  Floor RN notified.

## 2018-10-17 NOTE — Progress Notes (Addendum)
PROGRESS NOTE  Evan Moore  YTW:[id number]  DOB: May 13, 1971  DOA: 10/15/2018 PCP: Evan Malkin, FNP   Brief Admission Hx: Evan Moore is a 48 y.o. male with medical history significant of DM, Right foot ulcer who came to ER after being instructed by his podiatrist to go to ER.Pt came in from his podiatrist office for evaluation of a chronicwound on his right foot.   MDM/Assessment & Plan:   1. Osteomyelitis of right foot -continue IV antibiotic therapy, Pt refusing PICC line placement for long-term IV antibiotics, podiatry planning for OR 10/18/18.  2. 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  Diabetes coordinator consulted.  3. Essential hypertension-well-controlled, following. 4. Leukocytosis - WBC trending down with treatment.   DVT prophylaxis: Early ambulation per surgery Code Status: Full Family Communication: Patient updated at bedside Disposition Plan: Home when medically/surgically stabilized   Consultants:  Podiatry Allena Katz)  Procedures:  n/a  Antimicrobials:  Ceftriaxone 1/31  Vancomycin 1/31  Metronidazole 1/31   Subjective: Patient is without specific complaints this morning but still refusing PICC  Objective: Vitals:   10/16/18 1046 10/16/18 2206 10/17/18 0628 10/17/18 1014  BP: 126/87 (!) 164/81 122/67 122/67  Pulse:  76 75   Resp:  18 18   Temp:  98.4 F (36.9 C) 98.2 F (36.8 C)   TempSrc:  Oral Oral   SpO2:  98% 96%   Weight:      Height:        Intake/Output Summary (Last 24 hours) at 10/17/2018 1710 Last data filed at 10/17/2018 0900 Gross per 24 hour  Intake 1409.18 ml  Output -  Net 1409.18 ml   Filed Weights   10/15/18 1418  Weight: 117.9 kg     REVIEW OF SYSTEMS  As per history otherwise all reviewed and reported negative  Exam:  General exam:  Awake, alert, no distress, cooperative. Respiratory system: Clear. No increased work of breathing. Cardiovascular system: S1 & S2 heard, RRR. No JVD, murmurs, gallops, clicks or pedal edema. Gastrointestinal system: Abdomen is nondistended, soft and nontender. Normal bowel sounds heard. Central nervous system: Alert and oriented. No focal neurological deficits. Extremities: Insensate right foot with multiple open skin lesions, erythema noted on the right side. No purulent drainage seen.   Data Reviewed: Basic Metabolic Panel: Recent Labs  Lab 10/15/18 1516 10/16/18 0320 10/16/18 2226 10/17/18 0423  NA 129* 130*  --  134*  K 4.4 4.1  --  3.6  CL 93* 95*  --  101  CO2 26 26  --  25  GLUCOSE 311* 403* 439* 213*  BUN 25* 27*  --  23*  CREATININE 1.29* 1.48*  --  1.07  CALCIUM 8.9 8.6*  --  8.4*  MG  --   --   --  2.0   Liver Function Tests: Recent Labs  Lab 10/16/18 0320 10/17/18 0423  AST 12* 11*  ALT 16 15  ALKPHOS 91 81  BILITOT 0.7 0.3  PROT 8.1 7.3  ALBUMIN 3.5 3.0*   No results for input(s): LIPASE, AMYLASE in the last 168 hours. No results for input(s): AMMONIA in the last 168 hours. CBC: Recent Labs  Lab 10/15/18 1516 10/16/18 0320 10/17/18 0423  WBC 16.5* 11.7* 9.0  NEUTROABS  --   --  6.2  HGB 13.3 12.2* 11.5*  HCT 39.4 37.2* 34.8*  MCV 87.9 88.8  89.0  PLT 430* 407* 384   Cardiac Enzymes: No results for input(s): CKTOTAL, CKMB, CKMBINDEX, TROPONINI in the last 168 hours. CBG (last 3)  Recent Labs    10/17/18 0330 10/17/18 0751 10/17/18 1144  GLUCAP 223* 192* 165*   Recent Results (from the past 240 hour(s))  Culture, blood (routine x 2)     Status: None (Preliminary result)   Collection Time: 10/15/18  5:07 PM  Result Value Ref Range Status   Specimen Description BLOOD RIGHT ANTECUBITAL DRAWN BY RN  Final   Special Requests   Final    BOTTLES DRAWN AEROBIC AND ANAEROBIC Blood Culture results may not be optimal due to an excessive volume of  blood received in culture bottles   Culture   Final    NO GROWTH 2 DAYS Performed at [facility], [address] [place], [place] [address]    Report Status PENDING  Incomplete  Culture, blood (routine x 2)     Status: None (Preliminary result)   Collection Time: 10/15/18  5:24 PM  Result Value Ref Range Status   Specimen Description BLOOD LEFT ANTECUBITAL  Final   Special Requests   Final    BOTTLES DRAWN AEROBIC AND ANAEROBIC Blood Culture results may not be optimal due to an excessive volume of blood received in culture bottles   Culture   Final    NO GROWTH 2 DAYS Performed at [facility], [address] [place], [place] [address]    Report Status PENDING  Incomplete     Studies: Mr Foot Right Wo Contrast  Result Date: 10/15/2018 CLINICAL DATA:  Right foot infection. Soft tissue wound from spider bite. EXAM: MRI OF THE RIGHT FOREFOOT WITHOUT CONTRAST TECHNIQUE: Multiplanar, multisequence MR imaging of the right forefoot was performed. No intravenous contrast was administered. COMPARISON:  Radiographs dated 10/05/2018 FINDINGS: Bones/Joint/Cartilage There is abnormal signal throughout the fifth metatarsal. There is also focal cortical destruction of the lateral aspect of the distal fifth metatarsal adjacent to the soft tissue ulceration. The other bones of the foot demonstrate no significant abnormalities. Muscles and Tendons There is soft tissue edema around the tendons and within the muscles adjacent to the fifth metatarsal consistent with myositis. There is no discrete abscess. Soft tissues Deep soft tissue ulceration overlying the lateral aspect of the head of the fifth metatarsal. IMPRESSION: Osteomyelitis of the fifth metatarsal with adjacent myositis and cellulitis. Electronically Signed   By: Francene BoyersJames  Maxwell M.D.   On: 10/15/2018 19:14   [place] Arterial Abi (screening Lower Extremity)  Result Date: 10/16/2018 CLINICAL DATA:  Peripheral arterial disease. Worsening right foot wound.  EXAM: NONINVASIVE PHYSIOLOGIC VASCULAR STUDY OF BILATERAL LOWER EXTREMITIES TECHNIQUE: Evaluation of both lower extremities were performed at rest, including calculation of ankle-brachial indices with single level Doppler, pressure and pulse volume recording. COMPARISON:  09/09/2018 FINDINGS: Right ABI:  1.48 Left ABI:  1.54 Right Lower Extremity:  Normal arterial waveforms at the ankle. Left Lower Extremity:  Normal arterial waveforms at the ankle. 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 However, there continues to be normal waveforms at both ankles. No evidence for significant occlusive arterial disease. Electronically Signed   By: Richarda OverlieAdam  Henn M.D.   On: 10/16/2018 09:56   Dg Foot Complete Right  Result Date: 10/15/2018 CLINICAL DATA:  Right lateral forefoot wound near the base of the fifth metatarsal. EXAM: RIGHT FOOT COMPLETE -  3+ VIEW COMPARISON:  Right foot x-rays dated September 07, 2018. FINDINGS: Prominent soft tissue defect and soft tissue swelling lateral to the fifth MTP joint with new erosive changes of the fifth metatarsal head. No subcutaneous emphysema. No acute fracture or dislocation. Joint spaces are preserved. Bone mineralization is normal. Vascular calcifications. IMPRESSION: 1. Large soft tissue ulceration along the lateral forefoot with new osteomyelitis of the fifth metatarsal head. Electronically Signed   By: [name] M.D.   On: 10/15/2018 18:00   Scheduled Meds: . amitriptyline  10 mg Oral Daily  . escitalopram  10 mg Oral q morning - 10a  . feeding supplement (PRO-STAT SUGAR FREE 64)  30 mL Oral BID  . gabapentin  100-200 mg Oral See admin instructions  . hydrochlorothiazide  25 mg Oral Daily  . insulin aspart  0-20 Units Subcutaneous TID WC  . insulin aspart  0-5 Units Subcutaneous QHS  . insulin  aspart  12 Units Subcutaneous TID WC  . insulin detemir  20 Units Subcutaneous BID  . lisinopril  20 mg Oral Daily  . living well with diabetes book   Does not apply Once  . metroNIDAZOLE  500 mg Oral Q8H  . multivitamin-lutein  1 capsule Oral Daily  . ENSURE MAX PROTEIN  11 oz Oral Daily  . rOPINIRole  0.5 mg Oral QHS  . vitamin B-12  1,000 mcg Oral q morning - 10a   Continuous Infusions: . sodium chloride Stopped (10/16/18 2006)  . cefTRIAXone (ROCEPHIN)  IV 2 g (10/17/18 0332)  . vancomycin 1,000 mg (10/17/18 0925)    Active Problems:   Diabetic foot infection (HCC)   Essential hypertension   Type 2 diabetes mellitus without complication (HCC)   Hyponatremia   Osteomyelitis (HCC)   Lupus (HCC)   Time spent:   Standley Dakins, MD, FAAFP [facility]  If 7PM-7AM, please contact night-coverage [email or web address] Password [email or web address] 10/17/2018, 5:10 PM    LOS: 2 days

## 2018-10-17 NOTE — Progress Notes (Addendum)
Pharmacy Antibiotic Note  Evan Moore is a 48 y.o. male admitted on 10/15/2018 with cellulitis.  Pharmacy has been consulted for Vancomycin dosing.  Plan: Increase to 1000mg  IV every 12 hours.  Goal trough 15-20 mcg/mL.  F/u cxs and clinical progress Monitor V/S, labs and re-check levels as indicated  Height: 5\' 4"  (162.6 cm) Weight: 260 lb (117.9 kg) IBW/kg (Calculated) : 59.2  Temp (24hrs), Avg:98.2 F (36.8 C), Min:98.1 F (36.7 C), Max:98.4 F (36.9 C)  Recent Labs  Lab 10/15/18 1516 10/15/18 1607 10/16/18 0320 10/17/18 0423  WBC 16.5*  --  11.7* 9.0  CREATININE 1.29*  --  1.48* 1.07  LATICACIDVEN 1.2 1.1  --   --   VANCOTROUGH  --   --   --  12*    Normalized CrCl is 89 mls/min Estimated Creatinine Clearance: 102 mL/min (by C-G formula based on SCr of 1.07 mg/dL).    Allergies  Allergen Reactions  . Bee Venom    Antimicrobials this admission: Vancomycin 12/31>>  Rocephin 1/1 >> Flagyl 1/1 >>  Dose adjustments this admission: Dose increase 10/17/2018   Microbiology results: 12/31 BCx x 2: pending.  Mady Gemma, [facility]  10/17/2018 8:07 AM

## 2018-10-18 ENCOUNTER — Inpatient Hospital Stay (HOSPITAL_COMMUNITY): Payer: Medicaid - Out of State | Admitting: Anesthesiology

## 2018-10-18 ENCOUNTER — Inpatient Hospital Stay (HOSPITAL_COMMUNITY): Payer: Medicaid - Out of State

## 2018-10-18 ENCOUNTER — Encounter (HOSPITAL_COMMUNITY)
Admission: EM | Discharge status: Against Medical Advice | Payer: Self-pay | Source: Home / Self Care | Attending: Family Medicine

## 2018-10-18 ENCOUNTER — Other Ambulatory Visit: Payer: Self-pay

## 2018-10-18 ENCOUNTER — Encounter (HOSPITAL_COMMUNITY): Payer: Self-pay | Admitting: Emergency Medicine

## 2018-10-18 HISTORY — PX: AMPUTATION TOE: SHX6595

## 2018-10-18 LAB — COMPREHENSIVE METABOLIC PANEL
ALT: 17 U/L (ref 0–44)
AST: 12 U/L — ABNORMAL LOW (ref 15–41)
Albumin: 3 g/dL — ABNORMAL LOW (ref 3.5–5.0)
Alkaline Phosphatase: 77 U/L (ref 38–126)
Anion gap: 9 (ref 5–15)
BUN: 22 mg/dL — ABNORMAL HIGH (ref 6–20)
CO2: 23 mmol/L (ref 22–32)
Calcium: 8.5 mg/dL — ABNORMAL LOW (ref 8.9–10.3)
Chloride: 101 mmol/L (ref 98–111)
Creatinine, Ser: 1.21 mg/dL (ref 0.61–1.24)
GFR calc Af Amer: >60 mL/min (ref >60)
Glucose, Bld: 293 mg/dL — ABNORMAL HIGH (ref 70–99)
Potassium: 4.2 mmol/L (ref 3.5–5.1)
Sodium: 133 mmol/L — ABNORMAL LOW (ref 135–145)
Total Bilirubin: 0.5 mg/dL (ref 0.3–1.2)
Total Protein: 7.3 g/dL (ref 6.5–8.1)

## 2018-10-18 LAB — CBC WITH DIFFERENTIAL/PLATELET
Abs Immature Granulocytes: 0.06 10*3/uL (ref 0.00–0.07)
BASOS PCT: 1 %
Basophils Absolute: 0.1 10*3/uL (ref 0.0–0.1)
Eosinophils Absolute: 0.6 10*3/uL — ABNORMAL HIGH (ref 0.0–0.5)
Eosinophils Relative: 6 %
HCT: 34.8 % — ABNORMAL LOW (ref 39.0–52.0)
Hemoglobin: 11.3 g/dL — ABNORMAL LOW (ref 13.0–17.0)
Immature Granulocytes: 1 %
Lymphocytes Relative: 17 %
Lymphs Abs: 1.8 10*3/uL (ref 0.7–4.0)
MCH: 29 pg (ref 26.0–34.0)
MCHC: 32.5 g/dL (ref 30.0–36.0)
MCV: 89.5 fL (ref 80.0–100.0)
Monocytes Absolute: 0.8 10*3/uL (ref 0.1–1.0)
Monocytes Relative: 8 %
Neutro Abs: 7 10*3/uL (ref 1.7–7.7)
Neutrophils Relative %: 67 %
Platelets: 386 10*3/uL (ref 150–400)
RBC: 3.89 MIL/uL — ABNORMAL LOW (ref 4.22–5.81)
RDW: 12.2 % (ref 11.5–15.5)
WBC: 10.4 10*3/uL (ref 4.0–10.5)
nRBC: 0 % (ref 0.0–0.2)

## 2018-10-18 LAB — GLUCOSE, CAPILLARY
Glucose-Capillary: 270 mg/dL — ABNORMAL HIGH (ref 70–99)
Glucose-Capillary: 242 mg/dL — ABNORMAL HIGH (ref 70–99)
Glucose-Capillary: 185 mg/dL — ABNORMAL HIGH (ref 70–99)
Glucose-Capillary: 184 mg/dL — ABNORMAL HIGH (ref 70–99)
Glucose-Capillary: 136 mg/dL — ABNORMAL HIGH (ref 70–99)
Glucose-Capillary: 134 mg/dL — ABNORMAL HIGH (ref 70–99)
Glucose-Capillary: 347 mg/dL — ABNORMAL HIGH (ref 70–99)

## 2018-10-18 LAB — MAGNESIUM: MAGNESIUM: 1.7 mg/dL (ref 1.7–2.4)

## 2018-10-18 SURGERY — AMPUTATION, TOE
Anesthesia: Monitor Anesthesia Care | Site: Foot | Laterality: Right

## 2018-10-18 MED ORDER — GLYCOPYRROLATE PF 0.2 MG/ML IJ SOSY
PREFILLED_SYRINGE | INTRAMUSCULAR | Status: AC
  Filled 2018-10-18: qty 1

## 2018-10-18 MED ORDER — FENTANYL CITRATE (PF) 100 MCG/2ML IJ SOLN
INTRAMUSCULAR | Status: DC | PRN
  Administered 2018-10-18: 25 ug via INTRAVENOUS

## 2018-10-18 MED ORDER — BACITRACIN-NEOMYCIN-POLYMYXIN 400-5-5000 EX OINT
TOPICAL_OINTMENT | CUTANEOUS | Status: AC
  Filled 2018-10-18: qty 1

## 2018-10-18 MED ORDER — FENTANYL CITRATE (PF) 100 MCG/2ML IJ SOLN
INTRAMUSCULAR | Status: AC
  Filled 2018-10-18: qty 2

## 2018-10-18 MED ORDER — LACTATED RINGERS IV SOLN
INTRAVENOUS | Status: DC
  Administered 2018-10-18: 13:00:00 via INTRAVENOUS

## 2018-10-18 MED ORDER — GABAPENTIN 100 MG PO CAPS
100.0000 mg | ORAL_CAPSULE | Freq: Every day | ORAL | Status: DC
  Administered 2018-10-18 – 2018-10-20 (×3): 100 mg via ORAL
  Filled 2018-10-18 (×3): qty 1

## 2018-10-18 MED ORDER — PROPOFOL 500 MG/50ML IV EMUL
INTRAVENOUS | Status: DC | PRN
  Administered 2018-10-18: 75 ug/kg/min via INTRAVENOUS
  Administered 2018-10-18: 15:00:00 via INTRAVENOUS

## 2018-10-18 MED ORDER — ONDANSETRON HCL 4 MG/2ML IJ SOLN
INTRAMUSCULAR | Status: AC
  Filled 2018-10-18: qty 2

## 2018-10-18 MED ORDER — SEVOFLURANE IN SOLN
RESPIRATORY_TRACT | Status: AC
  Filled 2018-10-18: qty 250

## 2018-10-18 MED ORDER — 0.9 % SODIUM CHLORIDE (POUR BTL) OPTIME
TOPICAL | Status: DC | PRN
  Administered 2018-10-18: 1000 mL

## 2018-10-18 MED ORDER — LIDOCAINE HCL (PF) 1 % IJ SOLN
INTRAMUSCULAR | Status: AC
  Filled 2018-10-18: qty 30

## 2018-10-18 MED ORDER — LIDOCAINE 2% (20 MG/ML) 5 ML SYRINGE
INTRAMUSCULAR | Status: AC
  Filled 2018-10-18: qty 15

## 2018-10-18 MED ORDER — LIDOCAINE HCL 1 % IJ SOLN
INTRAMUSCULAR | Status: DC | PRN
  Administered 2018-10-18: 20 mL via INTRAMUSCULAR

## 2018-10-18 MED ORDER — GLYCOPYRROLATE 0.2 MG/ML IJ SOLN
INTRAMUSCULAR | Status: DC | PRN
  Administered 2018-10-18: 0.2 mg via INTRAVENOUS

## 2018-10-18 MED ORDER — ONDANSETRON HCL 4 MG/2ML IJ SOLN: 4.0000 mg | Freq: Once | INTRAMUSCULAR | Status: DC | PRN

## 2018-10-18 MED ORDER — SODIUM CHLORIDE 0.9% FLUSH
10.0000 mL | INTRAVENOUS | Status: DC | PRN
  Administered 2018-10-21: 10 mL
  Filled 2018-10-18: qty 40

## 2018-10-18 MED ORDER — MIDAZOLAM HCL 2 MG/2ML IJ SOLN
INTRAMUSCULAR | Status: AC
  Filled 2018-10-18: qty 2

## 2018-10-18 MED ORDER — KETOROLAC TROMETHAMINE 30 MG/ML IJ SOLN: 30.0000 mg | Freq: Once | INTRAMUSCULAR | Status: DC | PRN

## 2018-10-18 MED ORDER — SODIUM CHLORIDE 0.9 % IR SOLN
Status: DC | PRN
  Administered 2018-10-18: 3000 mL

## 2018-10-18 MED ORDER — BACITRACIN-NEOMYCIN-POLYMYXIN 400-5-5000 EX OINT
TOPICAL_OINTMENT | CUTANEOUS | Status: DC | PRN
  Administered 2018-10-18: 1 via TOPICAL

## 2018-10-18 MED ORDER — ARTIFICIAL TEARS OPHTHALMIC OINT
TOPICAL_OINTMENT | OPHTHALMIC | Status: AC
  Filled 2018-10-18: qty 3.5

## 2018-10-18 MED ORDER — ROCURONIUM BROMIDE 10 MG/ML (PF) SYRINGE
PREFILLED_SYRINGE | INTRAVENOUS | Status: AC
  Filled 2018-10-18: qty 20

## 2018-10-18 MED ORDER — GABAPENTIN 100 MG PO CAPS
200.0000 mg | ORAL_CAPSULE | Freq: Every morning | ORAL | Status: DC
  Administered 2018-10-18 – 2018-10-21 (×4): 200 mg via ORAL
  Filled 2018-10-18 (×4): qty 2

## 2018-10-18 MED ORDER — MIDAZOLAM HCL 5 MG/5ML IJ SOLN
INTRAMUSCULAR | Status: DC | PRN
  Administered 2018-10-18: 2 mg via INTRAVENOUS

## 2018-10-18 MED ORDER — HYDROMORPHONE HCL 1 MG/ML IJ SOLN: 0.2500 mg | INTRAMUSCULAR | Status: DC | PRN

## 2018-10-18 MED ORDER — PROPOFOL 10 MG/ML IV BOLUS
INTRAVENOUS | Status: AC
  Filled 2018-10-18: qty 60

## 2018-10-18 MED ORDER — HYDROCODONE-ACETAMINOPHEN 7.5-325 MG PO TABS: 1.0000 | ORAL_TABLET | Freq: Once | ORAL | Status: DC | PRN

## 2018-10-18 MED ORDER — BUPIVACAINE HCL (PF) 0.5 % IJ SOLN
INTRAMUSCULAR | Status: AC
  Filled 2018-10-18: qty 30

## 2018-10-18 MED ORDER — LIDOCAINE HCL (CARDIAC) PF 100 MG/5ML IV SOSY
PREFILLED_SYRINGE | INTRAVENOUS | Status: DC | PRN
  Administered 2018-10-18: 50 mg via INTRAVENOUS

## 2018-10-18 MED ORDER — MEPERIDINE HCL 50 MG/ML IJ SOLN: 6.2500 mg | INTRAMUSCULAR | Status: DC | PRN

## 2018-10-18 MED ORDER — ONDANSETRON HCL 4 MG/2ML IJ SOLN
INTRAMUSCULAR | Status: DC | PRN
  Administered 2018-10-18: 4 mg via INTRAVENOUS

## 2018-10-18 MED ORDER — PROPOFOL 10 MG/ML IV BOLUS
INTRAVENOUS | Status: DC | PRN
  Administered 2018-10-18: 30 mg via INTRAVENOUS

## 2018-10-18 MED ORDER — DEXAMETHASONE SODIUM PHOSPHATE 10 MG/ML IJ SOLN
INTRAMUSCULAR | Status: AC
  Filled 2018-10-18: qty 2

## 2018-10-18 SURGICAL SUPPLY — 43 items
BANDAGE ELASTIC 4 LF NS (GAUZE/BANDAGES/DRESSINGS) ×4 IMPLANT
BANDAGE ESMARK 4X12 BL STRL LF (DISPOSABLE) ×2 IMPLANT
BENZOIN TINCTURE PRP APPL 2/3 (GAUZE/BANDAGES/DRESSINGS) ×4 IMPLANT
BLADE AVERAGE 25MMX9MM (BLADE) ×1
BLADE AVERAGE 25X9 (BLADE) ×3 IMPLANT
BNDG CONFORM 2 STRL LF (GAUZE/BANDAGES/DRESSINGS) ×4 IMPLANT
BNDG ESMARK 4X12 BLUE STRL LF (DISPOSABLE) ×4
BNDG GAUZE ELAST 4 BULKY (GAUZE/BANDAGES/DRESSINGS) ×4 IMPLANT
CLOTH BEACON ORANGE TIMEOUT ST (SAFETY) ×4 IMPLANT
COVER LIGHT HANDLE STERIS (MISCELLANEOUS) ×8 IMPLANT
CUFF TOURNIQUET SINGLE 18IN (TOURNIQUET CUFF) ×4 IMPLANT
DECANTER SPIKE VIAL GLASS SM (MISCELLANEOUS) ×8 IMPLANT
DRSG ADAPTIC 3X8 NADH LF (GAUZE/BANDAGES/DRESSINGS) ×4 IMPLANT
ELECT REM PT RETURN 9FT ADLT (ELECTROSURGICAL) ×4
ELECTRODE REM PT RTRN 9FT ADLT (ELECTROSURGICAL) ×2 IMPLANT
GAUZE PACKING IODOFORM 1/4X15 (GAUZE/BANDAGES/DRESSINGS) ×4 IMPLANT
GAUZE SPONGE 4X4 12PLY STRL (GAUZE/BANDAGES/DRESSINGS) ×8 IMPLANT
GLOVE BIO SURGEON STRL SZ7 (GLOVE) ×8 IMPLANT
GLOVE BIO SURGEON STRL SZ7.5 (GLOVE) ×4 IMPLANT
GLOVE BIOGEL PI IND STRL 7.0 (GLOVE) ×4 IMPLANT
GLOVE BIOGEL PI IND STRL 7.5 (GLOVE) ×2 IMPLANT
GLOVE BIOGEL PI INDICATOR 7.0 (GLOVE) ×4
GLOVE BIOGEL PI INDICATOR 7.5 (GLOVE) ×2
GLOVE ECLIPSE 7.0 STRL STRAW (GLOVE) ×8 IMPLANT
GOWN STRL REUS W/ TWL LRG LVL3 (GOWN DISPOSABLE) ×2 IMPLANT
GOWN STRL REUS W/TWL LRG LVL3 (GOWN DISPOSABLE) ×10 IMPLANT
HANDPIECE INTERPULSE COAX TIP (DISPOSABLE) ×2
IV NS IRRIG 3000ML ARTHROMATIC (IV SOLUTION) ×4 IMPLANT
KIT TURNOVER KIT A (KITS) ×4 IMPLANT
MARKER SKIN DUAL TIP RULER LAB (MISCELLANEOUS) ×4 IMPLANT
NEEDLE HYPO 25X1 1.5 SAFETY (NEEDLE) ×16 IMPLANT
NS IRRIG 1000ML POUR BTL (IV SOLUTION) ×4 IMPLANT
PACK BASIC LIMB (CUSTOM PROCEDURE TRAY) ×4 IMPLANT
PAD ABD 5X9 TENDERSORB (GAUZE/BANDAGES/DRESSINGS) ×8 IMPLANT
PAD ARMBOARD 7.5X6 YLW CONV (MISCELLANEOUS) ×4 IMPLANT
SET BASIN LINEN APH (SET/KITS/TRAYS/PACK) ×4 IMPLANT
SET HNDPC FAN SPRY TIP SCT (DISPOSABLE) ×2 IMPLANT
SUT ETHILON 3 0 FSL (SUTURE) ×4 IMPLANT
SUT MON AB 2-0 CT1 36 (SUTURE) ×4 IMPLANT
SUT VIC AB 4-0 PS2 27 (SUTURE) ×4 IMPLANT
SUT VICRYL AB 3-0 FS1 BRD 27IN (SUTURE) ×4 IMPLANT
SYR 30ML LL (SYRINGE) ×4 IMPLANT
SYR CONTROL 10ML LL (SYRINGE) ×8 IMPLANT

## 2018-10-18 NOTE — Transfer of Care (Signed)
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

## 2018-10-18 NOTE — Anesthesia Postprocedure Evaluation (Signed)
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)  Patient location during evaluation: PACU Anesthesia Type: MAC Level of consciousness: awake and alert and oriented Pain management: pain level controlled Vital Signs Assessment: post-procedure vital signs reviewed and stable Respiratory status: spontaneous breathing Cardiovascular status: stable Postop Assessment: no apparent nausea or vomiting Anesthetic complications: no     Last Vitals:  Vitals:   10/18/18 1311 10/18/18 1600  BP: (!) 152/89 (P) 106/84  Pulse: 64 (P) 67  Resp: 17 (P) 17  Temp: 36.8 C (P) 36.5 C  SpO2: 97% (P) 100%    Last Pain:  Vitals:   10/18/18 1600  TempSrc:   PainSc: (P) 0-No pain                 Gautam Langhorst A

## 2018-10-18 NOTE — Brief Op Note (Signed)
10/18/2018  4:02 PM  PATIENT:  Evan Moore  48 y.o. 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   DISPOSITION OF SPECIMEN:  PATHOLOGY  COUNTS:  YES  TOURNIQUET:   Total Tourniquet Time Documented: Calf (Right) - 33 minutes Calf (Right) - 32 minutes Total: Calf (Right) - 65 minutes   DICTATION: .Viviann Spare Dictation  PLAN OF CARE: Admit to inpatient   PATIENT DISPOSITION:  PACU - hemodynamically stable.   Delay start of Pharmacological VTE agent (>24hrs) due to surgical blood loss or risk of bleeding: not applicable

## 2018-10-18 NOTE — Op Note (Signed)
PATIENT:  Evan Moore  48 y.o. 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   DISPOSITION OF SPECIMEN:  PATHOLOGY  COUNTS:  YES  TOURNIQUET:   Total Tourniquet Time Documented: Calf (Right) - 33 minutes Calf (Right) - 32 minutes Total: Calf (Right) - 65 minutes   PLAN OF CARE: Admit to inpatient   .Patient was brought into the operating room laid supine on the operating table. Ankle tourniquet was applied to the surgical extremity. Following IV sedation, a local block was achieved using 20 cc of mixture of 1% plain lidocaine with 0.5% marcaine. The foot was the prepped, scrubbed and draped in aseptic manner. The right lower extremity was elevated for 2 minute and the tourniquet was inflated at 250 mmHG.   Attention was directed towards the right foot. There was full thickness ulceration noted at the right 5th MPJ. There was purulent drainage noted with odor. There is localized redness extending to midfoot. #15 blade was used to dissect the entire ulcer out along with non viable tissue. There was slimy, liquefiction of the tissue noted on the dorsal aspect of the fifth met and lateral aspect of the 5th met. The wound was also opened dorsally where it was tunneling. At this time the right 5th toe was disarticulated at the MPJ and was resected out. There was bone erosion noted of the 5th met head. A clinical intra op picture was  taken to put in the chart. Using sagittal saw the head was resection. At this point the flexor tendon was resected. All the nonviable tissue was removed. Deep wound cultures were taken. Wound was irrigated using pulse lavage. A clean margin was taken and sent off for pathology.  At this time the tourniquet was deflated. All the bleeders were caturized. No major bleeding vessels noted. At this time 1 primary closure was done using 3-0 Nylon. A dry bulky dressing was applied.   Patient will transferred back to floor. I explained patients wife that I have closed the wound but because of his sugar and residual soft tissue inflammation the incision could dehisce. She understood and patient understood. Patient is happy that I was able to close the wound. Will follow up pathology and clean margin report along with wound culture.

## 2018-10-18 NOTE — Progress Notes (Signed)
Peripherally Inserted Central Catheter/Midline Placement  The IV Nurse has discussed with the patient and/or persons authorized to consent for the patient, the purpose of this procedure and the potential benefits and risks involved with this procedure.  The benefits include less needle sticks, lab draws from the catheter, and the patient may be discharged home with the catheter. Risks include, but not limited to, infection, bleeding, blood clot (thrombus formation), and puncture of an artery; nerve damage and irregular heartbeat and possibility to perform a PICC exchange if needed/ordered by physician.  Alternatives to this procedure were also discussed.  Bard Power PICC patient education guide, fact sheet on infection prevention and patient information card has been provided to patient /or left at bedside.    PICC/Midline Placement Documentation  PICC Single Lumen 10/18/18 PICC Right Basilic 45 cm 0 cm (Active)  Indication for Insertion or Continuance of Line Prolonged intravenous therapies 10/18/2018 11:33 AM  Exposed Catheter (cm) 0 cm 10/18/2018 11:33 AM  Site Assessment Clean;Dry;Intact 10/18/2018 11:33 AM  Line Status Flushed;Blood return noted;Saline locked 10/18/2018 11:33 AM  Dressing Type Transparent 10/18/2018 11:33 AM  Dressing Status Dry;Clean;Intact 10/18/2018 11:33 AM  Dressing Change Due 10/25/18 10/18/2018 11:33 AM       Audrie Gallus 10/18/2018, 11:34 AM

## 2018-10-18 NOTE — Progress Notes (Signed)
PROGRESS NOTE  Evan Moore  WER:[id number]  DOB: 18-Feb-1971  DOA: 10/15/2018 PCP: [name], FNP   Brief Admission Hx: Evan Moore is a 48 y.o. male with medical history significant of DM, Right foot ulcer who came to ER after being instructed by his podiatrist to go to ER.Pt came in from his podiatrist office for evaluation of a chronicwound on his right foot.   MDM/Assessment & Plan:   1. Osteomyelitis of right foot -continue IV antibiotic therapy, Pt refused PICC line placement for long-term IV antibiotic but now is agreeable, I asked the PICC line team to return to attempt to place, podiatry planning for OR 10/18/18.  2. 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  Diabetes coordinator appreciated.  3. Essential hypertension-well-controlled, following. 4. Leukocytosis - WBC trending down with treatment.   DVT prophylaxis: Early ambulation per surgery Code Status: Full Family Communication: Patient updated at bedside Disposition Plan: Home when medically/surgically stabilized   Consultants:  Podiatry Allena Katz)  Procedures:  n/a  Antimicrobials:  Ceftriaxone 1/31  Vancomycin 1/31  Metronidazole 1/31   Subjective: Patient says that he has reconsidered and now agrees to PICC line placement.   Objective: Vitals:   10/17/18 1014 10/17/18 2014 10/17/18 2152 10/18/18 0644  BP: 122/67  (!) 149/83 130/80  Pulse:   70 72  Resp:   20 16  Temp:   98.4 F (36.9 C) 98.1 F (36.7 C)  TempSrc:   Oral Oral  SpO2:  98% 97% 95%  Weight:      Height:        Intake/Output Summary (Last 24 hours) at 10/18/2018 1950 Last data filed at 10/18/2018 0330 Gross per 24 hour  Intake 880 ml  Output -  Net 880 ml   Filed Weights   10/15/18 1418  Weight: 117.9 kg     REVIEW OF SYSTEMS  As per history  otherwise all reviewed and reported negative  Exam:  General exam: Awake, alert, no distress, cooperative. Respiratory system: Clear. No increased work of breathing. Cardiovascular system: S1 & S2 heard, RRR. No JVD, murmurs, gallops, clicks or pedal edema. Gastrointestinal system: Abdomen is nondistended, soft and nontender. Normal bowel sounds heard. Central nervous system: Alert and oriented. No focal neurological deficits. Extremities: Insensate right foot with multiple open skin lesions, erythema noted on the right side. No purulent drainage seen.   Data Reviewed: Basic Metabolic Panel: Recent Labs  Lab 10/15/18 1516 10/16/18 0320 10/16/18 2226 10/17/18 0423 10/18/18 0537  NA 129* 130*  --  134* 133*  K 4.4 4.1  --  3.6 4.2  CL 93* 95*  --  101 101  CO2 26 26  --  25 23  GLUCOSE 311* 403* 439* 213* 293*  BUN 25* 27*  --  23* 22*  CREATININE 1.29* 1.48*  --  1.07 1.21  CALCIUM 8.9 8.6*  --  8.4* 8.5*  MG  --   --   --  2.0 1.7   Liver Function Tests: Recent Labs  Lab 10/16/18 0320 10/17/18 0423 10/18/18 0537  AST 12* 11* 12*  ALT 16 15 17   ALKPHOS 91 81 77  BILITOT 0.7 0.3 0.5  PROT 8.1 7.3 7.3  ALBUMIN 3.5 3.0* 3.0*   No results for input(s): LIPASE, AMYLASE in the last 168 hours. No results for input(s): AMMONIA in the last 168 hours. CBC:  Recent Labs  Lab 10/15/18 1516 10/16/18 0320 10/17/18 0423 10/18/18 0537  WBC 16.5* 11.7* 9.0 10.4  NEUTROABS  --   --  6.2 7.0  HGB 13.3 12.2* 11.5* 11.3*  HCT 39.4 37.2* 34.8* 34.8*  MCV 87.9 88.8 89.0 89.5  PLT 430* 407* 384 386   Cardiac Enzymes: No results for input(s): CKTOTAL, CKMB, CKMBINDEX, TROPONINI in the last 168 hours. CBG (last 3)  Recent Labs    10/17/18 2150 10/18/18 0324 10/18/18 0726  GLUCAP 337* 270* 242*   Recent Results (from the past 240 hour(s))  Culture, blood (routine x 2)     Status: None (Preliminary result)   Collection Time: 10/15/18  5:07 PM  Result Value Ref Range Status     Specimen Description BLOOD RIGHT ANTECUBITAL DRAWN BY RN  Final   Special Requests   Final    BOTTLES DRAWN AEROBIC AND ANAEROBIC Blood Culture results may not be optimal due to an excessive volume of blood received in culture bottles   Culture   Final    NO GROWTH 3 DAYS Performed at [facility], [address], [place], [place] [address]    Report Status PENDING  Incomplete  Culture, blood (routine x 2)     Status: None (Preliminary result)   Collection Time: 10/15/18  5:24 PM  Result Value Ref Range Status   Specimen Description BLOOD LEFT ANTECUBITAL  Final   Special Requests   Final    BOTTLES DRAWN AEROBIC AND ANAEROBIC Blood Culture results may not be optimal due to an excessive volume of blood received in culture bottles   Culture   Final    NO GROWTH 3 DAYS Performed at [facility], [address], [place], [place] [address]    Report Status PENDING  Incomplete  MRSA PCR Screening     Status: Abnormal   Collection Time: 10/17/18  3:24 PM  Result Value Ref Range Status   MRSA by PCR POSITIVE (A) NEGATIVE Final    Comment:        The GeneXpert MRSA Assay (FDA approved for NASAL specimens only), is one component of a comprehensive MRSA colonization surveillance program. It is not intended to diagnose MRSA infection nor to guide or monitor treatment for MRSA infections. RESULT CALLED TO, READ BACK BY AND VERIFIED WITH: Cristobal Goldmann @2029  10/17/18 [facility] Performed at [facility], [address], [place], [place] [address]      Studies: US Arterial Vanice Sarah (screening Lower Extremity)  Result Date: 10/16/2018 CLINICAL DATA:  Peripheral arterial disease. Worsening right foot wound. EXAM: NONINVASIVE PHYSIOLOGIC VASCULAR STUDY OF BILATERAL LOWER EXTREMITIES TECHNIQUE: Evaluation of both lower extremities were performed at rest, including calculation of ankle-brachial indices with single level Doppler, pressure and pulse volume recording. COMPARISON:  09/09/2018 FINDINGS: Right  ABI:  1.48 Left ABI:  1.54 Right Lower Extremity:  Normal arterial waveforms at the ankle. Left Lower Extremity:  Normal arterial waveforms at the ankle. 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 However, there continues to be normal waveforms at both ankles. No evidence for significant occlusive arterial disease. Electronically Signed   By: Richarda Overlie M.D.   On: 10/16/2018 09:56   Scheduled Meds: . amitriptyline  10 mg Oral Daily  . escitalopram  10 mg Oral q morning - 10a  . feeding supplement (PRO-STAT SUGAR FREE 64)  30 mL Oral BID  .  gabapentin  100 mg Oral QHS  . gabapentin  200 mg Oral q morning - 10a  . hydrochlorothiazide  25 mg Oral Daily  . insulin aspart  0-20 Units Subcutaneous TID WC  . insulin aspart  0-5 Units Subcutaneous QHS  . insulin aspart  14 Units Subcutaneous TID WC  . insulin detemir  20 Units Subcutaneous BID  . lisinopril  20 mg Oral Daily  . metroNIDAZOLE  500 mg Oral Q8H  . multivitamin-lutein  1 capsule Oral Daily  . ENSURE MAX PROTEIN  11 oz Oral Daily  . rOPINIRole  0.5 mg Oral QHS  . vitamin B-12  1,000 mcg Oral q morning - 10a   Continuous Infusions: . sodium chloride Stopped (10/16/18 2006)  . cefTRIAXone (ROCEPHIN)  IV 2 g (10/18/18 0330)  . vancomycin 1,000 mg (10/18/18 [id number])    Active Problems:   Diabetic foot infection (HCC)   Essential hypertension   Type 2 diabetes mellitus without complication (HCC)   Hyponatremia   Osteomyelitis (HCC)   Lupus (HCC)  Time spent:   Standley Dakinslanford Gayatri Teasdale, MD [facility]  If 7PM-7AM, please contact night-coverage [email or web address] Password [email or web address] 10/18/2018, 9:23 AM    LOS: 3 days

## 2018-10-18 NOTE — Anesthesia Procedure Notes (Signed)
Procedure Name: MAC Date/Time: 10/18/2018 2:20 PM Performed by: Andree Elk Amy A, CRNA Pre-anesthesia Checklist: Patient identified, Emergency Drugs available, Suction available, Timeout performed and Patient being monitored Patient Re-evaluated:Patient Re-evaluated prior to induction Oxygen Delivery Method: Non-rebreather mask

## 2018-10-18 NOTE — Progress Notes (Signed)
Pre-Op note:    Surgeon: Dr. Tyson Babinski.   DOS: 10/18/2018.  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 Cellulitis, Uncontrolled diabetes.   Planned procedure: Partial 5th ray amputation.   Podiatry physical exam:   Vascular: DP and PT pulses palpable. Capillary refill time is brisk to all lesser digits. Foot is warm to touch at the ulcer site.  Derm: Full thickness ulceration noted at the fifth MPJ right foot. Ulcer is measured to be 1.8x 1.3x0.8cm deep. Tunneling is present 6oclock about 3 cm deep. Skin discoloration noted on the dorsal aspect of the foot with redness extending to midfoot. Ulcer has odor and drainage. Does probe to capsule and nonviable tendon and tissue noted. Foot is warm to touch. There is also chronic fissure noted at the tip of the right hallux and right 2nd toe. There is chronic fissure noted at the right plantar heel as well.  Musculoskeletol: ulceration of the right fifth MPJ.  Neurology: Protective sensation is absent.    Labs: WBC trending down from 16.5 to 11.7 to 9.0  ESR: 88 CRP: 8.7 to 10.6 A1C: has increased from 10.5 (November 2019) to 11.4   Patient was examined at bedside.  Risk, benefits and complications were explained to the patient and his wife.  I gave no guarantees for the outcome of the procedure.  Informed consent was signed and witnessed.

## 2018-10-18 NOTE — Anesthesia Preprocedure Evaluation (Signed)
Anesthesia Evaluation  Patient identified by MRN, date of birth, ID band Patient awake    Reviewed: Allergy & Precautions, H&P , NPO status , Patient's Chart, lab work & pertinent test results  Airway Mallampati: III  TM Distance: >3 FB Neck ROM: full    Dental no notable dental hx.    Pulmonary sleep apnea ,  Won't wear CPAP   Pulmonary exam normal breath sounds clear to auscultation       Cardiovascular Exercise Tolerance: Good hypertension, negative cardio ROS   Rhythm:regular Rate:Normal     Neuro/Psych neuropathy negative neurological ROS  negative psych ROS   GI/Hepatic negative GI ROS, Neg liver ROS,   Endo/Other  diabetesMorbid obesity  Renal/GU negative Renal ROS  negative genitourinary   Musculoskeletal   Abdominal   Peds  Hematology negative hematology ROS (+)   Anesthesia Other Findings   Reproductive/Obstetrics negative OB ROS                             Anesthesia Physical  Anesthesia Plan  ASA: III  Anesthesia Plan: MAC   Post-op Pain Management:    Induction:   PONV Risk Score and Plan:   Airway Management Planned:   Additional Equipment:   Intra-op Plan:   Post-operative Plan:   Informed Consent: I have reviewed the patients History and Physical, chart, labs and discussed the procedure including the risks, benefits and alternatives for the proposed anesthesia with the patient or authorized representative who has indicated his/her understanding and acceptance.   Dental Advisory Given  Plan Discussed with: CRNA  Anesthesia Plan Comments: (General anesthesia back-up plan as needed)        Anesthesia Quick Evaluation

## 2018-10-19 LAB — GLUCOSE, CAPILLARY
Glucose-Capillary: 323 mg/dL — ABNORMAL HIGH (ref 70–99)
Glucose-Capillary: 311 mg/dL — ABNORMAL HIGH (ref 70–99)
Glucose-Capillary: 172 mg/dL — ABNORMAL HIGH (ref 70–99)
Glucose-Capillary: 141 mg/dL — ABNORMAL HIGH (ref 70–99)
Glucose-Capillary: 292 mg/dL — ABNORMAL HIGH (ref 70–99)

## 2018-10-19 LAB — CBC WITH DIFFERENTIAL/PLATELET
Abs Immature Granulocytes: 0.04 10*3/uL (ref 0.00–0.07)
Basophils Absolute: 0.1 10*3/uL (ref 0.0–0.1)
Basophils Relative: 1 %
Eosinophils Absolute: 0.4 10*3/uL (ref 0.0–0.5)
Eosinophils Relative: 4 %
HCT: 35.9 % — ABNORMAL LOW (ref 39.0–52.0)
Hemoglobin: 11.8 g/dL — ABNORMAL LOW (ref 13.0–17.0)
Immature Granulocytes: 0 %
Lymphocytes Relative: 18 %
Lymphs Abs: 1.8 10*3/uL (ref 0.7–4.0)
MCH: 29.5 pg (ref 26.0–34.0)
MCHC: 32.9 g/dL (ref 30.0–36.0)
MCV: 89.8 fL (ref 80.0–100.0)
Monocytes Absolute: 0.8 10*3/uL (ref 0.1–1.0)
Monocytes Relative: 8 %
Neutro Abs: 7 10*3/uL (ref 1.7–7.7)
Neutrophils Relative %: 69 %
Platelets: 409 10*3/uL — ABNORMAL HIGH (ref 150–400)
RBC: 4 MIL/uL — ABNORMAL LOW (ref 4.22–5.81)
RDW: 12.2 % (ref 11.5–15.5)
WBC: 10.1 10*3/uL (ref 4.0–10.5)
nRBC: 0 % (ref 0.0–0.2)

## 2018-10-19 LAB — COMPREHENSIVE METABOLIC PANEL
ALBUMIN: 3.1 g/dL — AB (ref 3.5–5.0)
ALT: 21 U/L (ref 0–44)
AST: 16 U/L (ref 15–41)
Alkaline Phosphatase: 86 U/L (ref 38–126)
Anion gap: 8 (ref 5–15)
BUN: 22 mg/dL — AB (ref 6–20)
CALCIUM: 9 mg/dL (ref 8.9–10.3)
CO2: 26 mmol/L (ref 22–32)
CREATININE: 1.14 mg/dL (ref 0.61–1.24)
Chloride: 100 mmol/L (ref 98–111)
GFR calc Af Amer: >60 mL/min (ref >60)
GFR calc non Af Amer: >60 mL/min (ref >60)
Glucose, Bld: 326 mg/dL — ABNORMAL HIGH (ref 70–99)
Potassium: 4.5 mmol/L (ref 3.5–5.1)
Sodium: 134 mmol/L — ABNORMAL LOW (ref 135–145)
Total Bilirubin: 0.3 mg/dL (ref 0.3–1.2)
Total Protein: 7.5 g/dL (ref 6.5–8.1)

## 2018-10-19 LAB — MAGNESIUM: MAGNESIUM: 1.6 mg/dL — AB (ref 1.7–2.4)

## 2018-10-19 MED ORDER — INSULIN ASPART 100 UNIT/ML ~~LOC~~ SOLN
15.0000 [IU] | Freq: Three times a day (TID) | SUBCUTANEOUS | Status: DC
  Administered 2018-10-19 – 2018-10-21 (×8): 15 [IU] via SUBCUTANEOUS

## 2018-10-19 MED ORDER — INSULIN DETEMIR 100 UNIT/ML ~~LOC~~ SOLN
24.0000 [IU] | Freq: Two times a day (BID) | SUBCUTANEOUS | Status: DC
  Administered 2018-10-19 – 2018-10-21 (×5): 24 [IU] via SUBCUTANEOUS
  Filled 2018-10-19 (×7): qty 0.24

## 2018-10-19 MED ORDER — OXYCODONE-ACETAMINOPHEN 5-325 MG PO TABS
1.0000 | ORAL_TABLET | ORAL | Status: DC | PRN
  Administered 2018-10-19: 1 via ORAL
  Filled 2018-10-19: qty 1

## 2018-10-19 NOTE — Progress Notes (Signed)
PROGRESS NOTE  Evan Moore  WGN:[id number]  DOB: 12/06/1970  DOA: 10/15/2018 PCP: Evan Malkinoberts, Maurice, FNP   Brief Admission Hx: Evan Moore is a 48 y.o. male with medical history significant of DM, Right foot ulcer who came to ER after being instructed by his podiatrist to go to ER.  Pt came in from his podiatrist office for evaluation of a chronicwound on his right foot.   MDM/Assessment & Plan:   1. Osteomyelitis of right foot -continue IV antibiotic therapy, PICC line placed for long-term home IV antibiotics.  Care management aware. 2. 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  3. 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  Diabetes coordinator appreciated.  4. Essential hypertension-well-controlled, following. 5. Leukocytosis - WBC normalized with treatment.    DVT prophylaxis: Early ambulation per surgery team Code Status: Full Family Communication: Patient updated at bedside Disposition Plan: Home when medically/surgically stabilized  Consultants:  Podiatry Allena Katz([place])  Procedures:  n/a  Antimicrobials:  Ceftriaxone 1/31 >  Vancomycin 1/31 >  Metronidazole 1/31>   Subjective: Patient complains of throbbing pain in foot from surgery otherwise no other complaints.  Tolerated PICC line placement.   Objective: Vitals:   10/18/18 1615 10/18/18 1643 10/18/18 2203 10/19/18 0622  BP: 127/76 122/75 127/80 116/76  Pulse: 68 61 66 72  Resp: 15 16 20 17   Temp:  98 F (36.7 C) (!) 97.5 F (36.4 C) 98.7 F (37.1 C)  TempSrc:  Oral Oral Oral  SpO2: 95% 97% 99% 94%  Weight:      Height:        Intake/Output Summary (Last 24 hours) at 10/19/2018 1103 Last data filed at 10/19/2018  0900 Gross per 24 hour  Intake 1580 ml  Output 5 ml  Net 1575 ml   Filed Weights   10/15/18 1418  Weight: 117.9 kg   REVIEW OF SYSTEMS  As per history otherwise all reviewed and reported negative  Exam:  General exam: Awake, alert, no distress, cooperative. Respiratory system: Clear. No increased work of breathing. Cardiovascular system: S1 & S2 heard, RRR. No JVD, murmurs, gallops, clicks or pedal edema. Gastrointestinal system: Abdomen is nondistended, soft and nontender. Normal bowel sounds heard. Central nervous system: Alert and oriented. No focal neurological deficits. Extremities: right foot wounds clean and dry and intact.    Data Reviewed: Basic Metabolic Panel: Recent Labs  Lab 10/15/18 1516 10/16/18 0320 10/16/18 2226 10/17/18 0423 10/18/18 0537 10/19/18 0705  NA 129* 130*  --  134* 133* 134*  K 4.4 4.1  --  3.6 4.2 4.5  CL 93* 95*  --  101 101 100  CO2 26 26  --  25 23 26   GLUCOSE 311* 403* 439* 213* 293* 326*  BUN 25* 27*  --  23* 22* 22*  CREATININE 1.29* 1.48*  --  1.07 1.21 1.14  CALCIUM 8.9 8.6*  --  8.4* 8.5* 9.0  MG  --   --   --  2.0 1.7 1.6*   Liver Function Tests: Recent Labs  Lab 10/16/18 0320 10/17/18 0423 10/18/18 0537 10/19/18 0705  AST 12* 11* 12* 16  ALT 16 15 17 21   ALKPHOS 91 81 77 86  BILITOT 0.7 0.3 0.5 0.3  PROT 8.1 7.3 7.3 7.5  ALBUMIN 3.5 3.0* 3.0* 3.1*   No results for input(s): LIPASE, AMYLASE in the last 168 hours. No results for input(s): AMMONIA in the last 168 hours. CBC: Recent Labs  Lab 10/15/18 1516 10/16/18 0320 10/17/18 0423 10/18/18 0537 10/19/18 0705  WBC 16.5* 11.7* 9.0 10.4 10.1  NEUTROABS  --   --  6.2 7.0 7.0  HGB 13.3 12.2* 11.5* 11.3* 11.8*  HCT 39.4 37.2* 34.8* 34.8* 35.9*  MCV 87.9 88.8 89.0 89.5 89.8  PLT 430* 407* 384 386 409*   Cardiac Enzymes: No results for input(s): CKTOTAL, CKMB, CKMBINDEX, TROPONINI in the last 168 hours. CBG (last 3)  Recent Labs    10/18/18 2201  10/19/18 0254 10/19/18 0720  GLUCAP 347* 323* 311*   Recent Results (from the past 240 hour(s))  Culture, blood (routine x 2)     Status: None (Preliminary result)   Collection Time: 10/15/18  5:07 PM  Result Value Ref Range Status   Specimen Description BLOOD RIGHT ANTECUBITAL DRAWN BY RN  Final   Special Requests   Final    BOTTLES DRAWN AEROBIC AND ANAEROBIC Blood Culture results may not be optimal due to an excessive volume of blood received in culture bottles   Culture   Final    NO GROWTH 4 DAYS Performed at [facility], [address], [place], [place] [address]    Report Status PENDING  Incomplete  Culture, blood (routine x 2)     Status: None (Preliminary result)   Collection Time: 10/15/18  5:24 PM  Result Value Ref Range Status   Specimen Description BLOOD LEFT ANTECUBITAL  Final   Special Requests   Final    BOTTLES DRAWN AEROBIC AND ANAEROBIC Blood Culture results may not be optimal due to an excessive volume of blood received in culture bottles   Culture   Final    NO GROWTH 4 DAYS Performed at [facility], [address] [place], [place] [address]    Report Status PENDING  Incomplete  MRSA PCR Screening     Status: Abnormal   Collection Time: 10/17/18  3:24 PM  Result Value Ref Range Status   MRSA by PCR POSITIVE (A) NEGATIVE Final    Comment:        The GeneXpert MRSA Assay (FDA approved for NASAL specimens only), is one component of a comprehensive MRSA colonization surveillance program. It is not intended to diagnose MRSA infection nor to guide or monitor treatment for MRSA infections. RESULT CALLED TO, READ BACK BY AND VERIFIED WITH: K GRAVES,RN @2029  10/17/18 [facility] Performed at [facility], [address] [place], [place] [address]   Aerobic/Anaerobic Culture (surgical/deep wound)     Status: None (Preliminary result)   Collection Time: 10/18/18  3:02 PM  Result Value Ref Range Status   Specimen Description   Final    FOOT Performed at [facility], [address] [place], [place] [address]    Special Requests   Final    RIGHT FOOT Performed at [facility], [address], [place], [place] [address]    Gram Stain   Final    NO WBC SEEN RARE GRAM POSITIVE COCCI Performed at [facility], [address] [place], [place] [address]    Culture CULTURE REINCUBATED FOR BETTER GROWTH  Final   Report Status PENDING  Incomplete     Studies: Dg Chest Port 1 View  Result Date: 10/18/2018 CLINICAL DATA:  PICC line placement. EXAM: PORTABLE CHEST 1 VIEW COMPARISON:  None. FINDINGS: Stable cardiomegaly. No pneumothorax or pleural effusion is noted. No acute pulmonary disease is noted. Bony thorax is unremarkable. Right-sided PICC line is noted with distal tip in expected position of the SVC. IMPRESSION: Right-sided PICC line is noted with distal tip in expected position of SVC. Mild cardiomegaly. No other abnormality seen in the chest. Electronically Signed   By: [name], M.D.   On: 10/18/2018 12:01   Dg Foot Complete Right  Result Date: 10/18/2018 CLINICAL DATA:  Postoperative exam after amputation of the little toe and distal fifth metatarsal. EXAM: RIGHT FOOT COMPLETE - 3+ VIEW COMPARISON:  10/15/2018 FINDINGS: Interval amputation of the fifth ray at the level of the mid fifth metatarsal. The remaining bones of the right foot are normal other than minimal degenerative arthritic changes of the first MTP joint. IMPRESSION: Interval amputation of the fifth ray at the level of the mid fifth metatarsal. Electronically Signed   By: [name] M.D.   On: 10/18/2018 16:31   Scheduled Meds: . amitriptyline  10 mg Oral Daily  . escitalopram  10 mg Oral q morning - 10a  . feeding supplement (PRO-STAT SUGAR FREE 64)  30 mL Oral BID  . gabapentin  100 mg Oral QHS  . gabapentin  200 mg Oral q morning - 10a  . hydrochlorothiazide  25 mg Oral Daily  . insulin aspart  0-20 Units Subcutaneous TID WC  . insulin aspart  0-5 Units  Subcutaneous QHS  . insulin aspart  15 Units Subcutaneous TID WC  . insulin detemir  24 Units Subcutaneous BID  . lisinopril  20 mg Oral Daily  . metroNIDAZOLE  500 mg Oral Q8H  . multivitamin-lutein  1 capsule Oral Daily  . ENSURE MAX PROTEIN  11 oz Oral Daily  . rOPINIRole  0.5 mg Oral QHS  . vitamin B-12  1,000 mcg Oral q morning - 10a   Continuous Infusions: . sodium chloride Stopped (10/16/18 2006)  . cefTRIAXone (ROCEPHIN)  IV 2 g (10/19/18 0330)  . vancomycin 1,000 mg (10/19/18 0851)    Active Problems:   Diabetic foot infection (HCC)   Essential hypertension   Type 2 diabetes mellitus without complication (HCC)   Hyponatremia   Osteomyelitis (HCC)   Lupus (HCC)  Time spent:   Standley Dakins, MD [facility]  If 7PM-7AM, please contact night-coverage [email or web address] Password [email or web address] 10/19/2018, 11:03 AM    LOS: 4 days

## 2018-10-19 NOTE — Progress Notes (Signed)
Podiatry Progress note:   Patient seen at bedside. Patients wife is present in the room today. Patient is s/p partial 5th ray amputation DOS 10/18/2018. POD #1. Patient is resting on bed. He states he had no pain over night but this morning he had sharp pain when he tried to walk on it He denies any calf pain or chest pain. He states they gave him some protein stuff too drink but he did not drink it because it taste to sweet. I explained that he needs to take that to increase his albumin level which helps with healing.   Physical exam: The dressing was not changed today. It clean, dry and intact. No strikethrough noted. Capillary refill time is brisk to all four lesser digits on right foot.   OR wound cultures: Growing Staph aureus. Will follow up on sensitivity report.  OR bone pathology: pending.  Clean margin is pending.   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  Uncontrolled diabetes with neuropathy.   Plan: Patient examined and evaluated.  Dressing will be changed by me tomorrow. Keep the foot elevated.  Patient can be partial weightbearing with surgical shoe or knee scooter.  Patient will need 4 to 6 weeks of IV antibiotics and infusion company to assists with it.  Pending OR pathology and deep wound culture report.

## 2018-10-20 LAB — CULTURE, BLOOD (ROUTINE X 2)
CULTURE: NO GROWTH
Culture: NO GROWTH

## 2018-10-20 LAB — GLUCOSE, CAPILLARY
Glucose-Capillary: 307 mg/dL — ABNORMAL HIGH (ref 70–99)
Glucose-Capillary: 252 mg/dL — ABNORMAL HIGH (ref 70–99)
Glucose-Capillary: 170 mg/dL — ABNORMAL HIGH (ref 70–99)
Glucose-Capillary: 209 mg/dL — ABNORMAL HIGH (ref 70–99)
Glucose-Capillary: 294 mg/dL — ABNORMAL HIGH (ref 70–99)

## 2018-10-20 LAB — VANCOMYCIN, TROUGH: Vancomycin Tr: 14 ug/mL — ABNORMAL LOW (ref 15–20)

## 2018-10-20 NOTE — Progress Notes (Addendum)
Podiatry Progress note:   Patient seen at bedside. Patients wife is present in the room today. Patient is s/p partial 5th ray amputation DOS 10/18/2018. POD #2. Patient is resting on bed. Patient had less pain today than the first day. He states he has been elevating the foot.    Physical exam: Amputation site, the stiches are intact. No active bleeding noted. Localized redness noted consistent with residual inflammation. No drainage noted. Skin fissure noted on the right hallux tip, right second toe tip and heel. No signs of infection noted at the fissure site. Pedal pulses palpable.  OR wound cultures: Growing Staph aureus. Will follow up on sensitivity report.  OR bone pathology: pending.  Clean margin is pending.   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  Uncontrolled diabetes with neuropathy.   Plan: Patient examined and evaluated at bedside along with his wife.  Dressing was changed. Please DO NOT CHANGE the dressing. Keep the dressing clean, dry and intact.  Patient can be partial weightbearing with surgical shoe or knee scooter.  Patient will need 4 to 6 weeks of IV antibiotics and infusion company to assists with it.  Pending OR pathology and deep wound culture report. Once the wound culture reports comes back, can finalize the antibiotics for discharge.  Patient will not need any assistance from wound care point. He will need Nurse from IV infusion. Please give patient percocet RX for 5 days every six hours for pain as needed upon discharge to home.    Patient is stable to discharge from my point of view to home. He can follow up with me on Friday in my [place] office. I will contact the patient with date and time.

## 2018-10-20 NOTE — Progress Notes (Signed)
Pharmacy Antibiotic Note   Today is day #6 of vancomycin and day #5 of ceftriaxone and metronidazole therapy for this 48 yo male with osteomyelitis of right foot.  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  WBC count is trending down and patient has remained afebrile.  Blood cultures have shown no growth to date, but wound culture shows few S. aureus.  Vancomcyin trough this morning is 51mcg/mL, which is slightly below therapeutic goal range, but with long-term therapy, accumulation will occur, bringing the trough into desired range. Renal function appears stable.   Plan: Continue vancomycin 1g IV q12h ---PICC line placed for long-term tx  Goal vancomycin  trough range : 15-20 mcg/mL.  F/u cxs and clinical progress Monitor V/S, labs and re-check VTs as indicated  Height: 5\' 4"  (162.6 cm) Weight: 260 lb (117.9 kg) IBW/kg (Calculated) : 59.2  Temp (24hrs), Avg:98.2 F (36.8 C), Min:97.7 F (36.5 C), Max:98.6 F (37 C)  Recent Labs  Lab 10/15/18 1516 10/15/18 1607 10/16/18 0320 10/17/18 0423 10/18/18 0537 10/19/18 0705 10/20/18 0814  WBC 16.5*  --  11.7* 9.0 10.4 10.1  --   CREATININE 1.29*  --  1.48* 1.07 1.21 1.14  --   LATICACIDVEN 1.2 1.1  --   --   --   --   --   VANCOTROUGH  --   --   --  12*  --   --  14*    Normalized CrCl is 89 mls/min Estimated Creatinine Clearance: 95.7 mL/min (by C-G formula based on SCr of 1.14 mg/dL).      Antimicrobials this admission: Vancomycin 12/31>>  Rocephin 1/1 >> Flagyl 1/1 >>  Dose adjustments this admission: Dose increased 10/17/2018   Microbiology results: 12/31 BC x 2: NG x4 days 1/2 MRSA PCR: positive  1/3 wound Cx: few S. aureus    Thank you for allowing pharmacy to be a part of this patient's care.  Tama High 10/20/2018 9:31 AM

## 2018-10-20 NOTE — ED Notes (Signed)
Hourly rounding complete for patient throughout the shift. Patient given band-aids for feet per patient request. Patient also given hospital socks.

## 2018-10-20 NOTE — Progress Notes (Signed)
PROGRESS NOTE  Evan FamJoseph Pianka  NWG:[id number]  DOB: 10/16/1970  DOA: 10/15/2018 PCP: Ian Malkinoberts, Maurice, FNP  Brief Admission Hx: Evan Moore is a 48 y.o. male with medical history significant of DM, Right foot ulcer who came to ER after being instructed by his podiatrist to go to ER.  Pt came in from his podiatrist office for evaluation of a chronicwound on his right foot.   MDM/Assessment & Plan:   1. Osteomyelitis of right foot -continue IV antibiotic therapy, PICC line placed for long-term home IV antibiotics.  Care management aware.  Awaiting deep wound culture to guide antibiotics.  2. POD#2 s/p partial fifth ray amputation right foot - Continue pain management, wound care per surgery team.  3. 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  Diabetes coordinator appreciated.  4. Essential hypertension-well-controlled, following. 5. Leukocytosis - WBC normalized with treatment.    DVT prophylaxis: Early ambulation per surgery team Code Status: Full Family Communication: Patient updated at bedside Disposition Plan: anticipating DC home in 1-2 days  Consultants:  Podiatry Allena Katz([place])  Procedures:  n/a  Antimicrobials:  Ceftriaxone 1/31 >  Vancomycin 1/31 >  Metronidazole 1/31>   Subjective: Patient has been ambulating in room, Pain is more manageable today.   Objective: Vitals:   10/19/18 0622 10/19/18 1536 10/19/18 2141 10/20/18 0532  BP: 116/76 123/70 116/65 136/79  Pulse: 72 69 75 69  Resp: 17 16 18 16   Temp: 98.7 F (37.1 C) 98.6 F (37 C) 98.2 F (36.8 C) 97.7 F (36.5 C)  TempSrc: Oral Oral Oral Oral  SpO2: 94% 95% 97% 96%  Weight:      Height:        Intake/Output Summary (Last 24 hours) at 10/20/2018 1134 Last data filed at  10/20/2018 0900 Gross per 24 hour  Intake 720 ml  Output -  Net 720 ml   Filed Weights   10/15/18 1418  Weight: 117.9 kg   REVIEW OF SYSTEMS  As per history otherwise all reviewed and reported negative  Exam:  General exam: Awake, alert, no distress, cooperative. Respiratory system: BBS CTA. No increased work of breathing. Cardiovascular system: S1 & S2 heard, RRR. No JVD, murmurs, gallops, clicks or pedal edema. Gastrointestinal system: Abdomen is nondistended, soft and nontender. Normal bowel sounds heard. Central nervous system: Alert and oriented. No focal neurological deficits. Extremities: right foot wounds clean and dry and intact.    Data Reviewed: Basic Metabolic Panel: Recent Labs  Lab 10/15/18 1516 10/16/18 0320 10/16/18 2226 10/17/18 0423 10/18/18 0537 10/19/18 0705  NA 129* 130*  --  134* 133* 134*  K 4.4 4.1  --  3.6 4.2 4.5  CL 93* 95*  --  101 101 100  CO2 26 26  --  25 23 26   GLUCOSE 311* 403* 439* 213* 293* 326*  BUN 25* 27*  --  23* 22* 22*  CREATININE 1.29* 1.48*  --  1.07 1.21 1.14  CALCIUM 8.9 8.6*  --  8.4* 8.5* 9.0  MG  --   --   --  2.0 1.7 1.6*   Liver Function Tests: Recent Labs  Lab 10/16/18 0320 10/17/18 0423 10/18/18 0537 10/19/18 0705  AST 12* 11* 12* 16  ALT 16 15 17 21   ALKPHOS 91 81 77 86  BILITOT 0.7 0.3 0.5 0.3  PROT  8.1 7.3 7.3 7.5  ALBUMIN 3.5 3.0* 3.0* 3.1*   No results for input(s): LIPASE, AMYLASE in the last 168 hours. No results for input(s): AMMONIA in the last 168 hours. CBC: Recent Labs  Lab 10/15/18 1516 10/16/18 0320 10/17/18 0423 10/18/18 0537 10/19/18 0705  WBC 16.5* 11.7* 9.0 10.4 10.1  NEUTROABS  --   --  6.2 7.0 7.0  HGB 13.3 12.2* 11.5* 11.3* 11.8*  HCT 39.4 37.2* 34.8* 34.8* 35.9*  MCV 87.9 88.8 89.0 89.5 89.8  PLT 430* 407* 384 386 409*   Cardiac Enzymes: No results for input(s): CKTOTAL, CKMB, CKMBINDEX, TROPONINI in the last 168 hours. CBG (last 3)  Recent Labs    10/20/18 0304  10/20/18 0722 10/20/18 1112  GLUCAP 307* 252* 170*   Recent Results (from the past 240 hour(s))  Culture, blood (routine x 2)     Status: None   Collection Time: 10/15/18  5:07 PM  Result Value Ref Range Status   Specimen Description BLOOD RIGHT ANTECUBITAL DRAWN BY RN  Final   Special Requests   Final    BOTTLES DRAWN AEROBIC AND ANAEROBIC Blood Culture results may not be optimal due to an excessive volume of blood received in culture bottles   Culture   Final    NO GROWTH 5 DAYS Performed at [facility], [address] [place], [place] [address]    Report Status 10/20/2018 FINAL  Final  Culture, blood (routine x 2)     Status: None   Collection Time: 10/15/18  5:24 PM  Result Value Ref Range Status   Specimen Description BLOOD LEFT ANTECUBITAL  Final   Special Requests   Final    BOTTLES DRAWN AEROBIC AND ANAEROBIC Blood Culture results may not be optimal due to an excessive volume of blood received in culture bottles   Culture   Final    NO GROWTH 5 DAYS Performed at [facility], [address] [place], [place] [address]    Report Status 10/20/2018 FINAL  Final  MRSA PCR Screening     Status: Abnormal   Collection Time: 10/17/18  3:24 PM  Result Value Ref Range Status   MRSA by PCR POSITIVE (A) NEGATIVE Final    Comment:        The GeneXpert MRSA Assay (FDA approved for NASAL specimens only), is one component of a comprehensive MRSA colonization surveillance program. It is not intended to diagnose MRSA infection nor to guide or monitor treatment for MRSA infections. RESULT CALLED TO, READ BACK BY AND VERIFIED WITH: K GRAVES,RN @2029  10/17/18 [facility] Performed at [facility], [address] [place], [place] [address]   Aerobic/Anaerobic Culture (surgical/deep wound)     Status: None (Preliminary result)   Collection Time: 10/18/18  3:02 PM  Result Value Ref Range Status   Specimen Description   Final    FOOT Performed at [facility], [address]  [place], [place] [address]    Special Requests   Final    RIGHT FOOT Performed at [facility], [address] [place], [place] [address]    Gram Stain   Final    NO WBC SEEN RARE GRAM POSITIVE COCCI Performed at [facility], [address] [place], [place] [address]    Culture FEW STAPHYLOCOCCUS AUREUS  Final   Report Status PENDING  Incomplete     Studies: Dg Chest Port 1 View  Result Date: 10/18/2018 CLINICAL DATA:  PICC line placement. EXAM: PORTABLE CHEST 1 VIEW COMPARISON:  None. FINDINGS: Stable cardiomegaly. No pneumothorax or pleural effusion is noted. No acute pulmonary disease is noted. Bony thorax is unremarkable. Right-sided PICC line is noted with distal tip in expected position of the SVC. IMPRESSION: Right-sided PICC line is noted with distal tip in expected position of SVC. Mild cardiomegaly. No other abnormality seen in the chest. Electronically Signed   By: [name], M.D.   On: 10/18/2018 12:01   Dg Foot Complete Right  Result Date: 10/18/2018 CLINICAL DATA:  Postoperative exam after amputation of the little toe and distal fifth metatarsal. EXAM: RIGHT FOOT COMPLETE - 3+ VIEW COMPARISON:  10/15/2018 FINDINGS: Interval amputation of the fifth ray at the level of the mid fifth metatarsal. The remaining bones of the right foot are normal other than minimal degenerative arthritic changes of the first MTP joint. IMPRESSION: Interval amputation of the fifth ray at the level of the mid fifth metatarsal. Electronically Signed   By: [name] M.D.   On: 10/18/2018 16:31   Scheduled Meds: . amitriptyline  10 mg Oral Daily  . escitalopram  10 mg Oral q morning - 10a  . feeding supplement (PRO-STAT SUGAR FREE 64)  30 mL Oral BID  . gabapentin  100 mg Oral QHS  . gabapentin  200 mg Oral q morning - 10a  . hydrochlorothiazide  25 mg Oral Daily  . insulin aspart  0-20 Units Subcutaneous TID WC  . insulin aspart  0-5 Units Subcutaneous QHS  . insulin aspart  15 Units  Subcutaneous TID WC  . insulin detemir  24 Units Subcutaneous BID  . lisinopril  20 mg Oral Daily  . metroNIDAZOLE  500 mg Oral Q8H  . multivitamin-lutein  1 capsule Oral Daily  . ENSURE MAX PROTEIN  11 oz Oral Daily  . rOPINIRole  0.5 mg Oral QHS  . vitamin B-12  1,000 mcg Oral q morning - 10a   Continuous Infusions: . sodium chloride Stopped (10/16/18 2006)  . cefTRIAXone (ROCEPHIN)  IV 2 g (10/20/18 0326)  . vancomycin 1,000 mg (10/20/18 1610)    Active Problems:   Diabetic foot infection (HCC)   Essential hypertension   Type 2 diabetes mellitus without complication (HCC)   Hyponatremia   Osteomyelitis (HCC)   Lupus (HCC)  Time spent:   Standley Dakins, MD [facility]  If 7PM-7AM, please contact night-coverage [email or web address] Password [email or web address] 10/20/2018, 11:34 AM    LOS: 5 days

## 2018-10-21 ENCOUNTER — Encounter (HOSPITAL_COMMUNITY): Payer: Self-pay | Admitting: Podiatry

## 2018-10-21 LAB — GLUCOSE, CAPILLARY
Glucose-Capillary: 328 mg/dL — ABNORMAL HIGH (ref 70–99)
Glucose-Capillary: 232 mg/dL — ABNORMAL HIGH (ref 70–99)
Glucose-Capillary: 157 mg/dL — ABNORMAL HIGH (ref 70–99)

## 2018-10-21 MED ORDER — VANCOMYCIN IV (FOR PTA / DISCHARGE USE ONLY): 1000.0000 mg | Freq: Two times a day (BID) | INTRAVENOUS | 0 refills | Status: AC

## 2018-10-21 NOTE — Care Management Note (Signed)
Case Management Note  Patient Details  Name: Evan Moore MRN: [id number] Date of Birth: 04-28-1971  Subjective/Objective:    S/p Ray amputation. Will need Iv antibiotics at home. Patient reports he uses [employer] [employer] for home health nursing. Contacted [facility], they will provide RN for patient, Will need outside agency to provide IV antibiotic. Patient has no preference, [facility] willing to provide. Anticipate DC home today, will receive both doses of Vanc here today, with [facility] to start care tomorrow, 10/22/2018.                 Action/Plan: DC home today. Referral sent to [facility] and [facility]. Need OPAT ordres for [facility] and HH orders for [facility].   Expected Discharge Date:     10/21/2018             Expected Discharge Plan:  Home w Home Health Services  In-House Referral:     Discharge planning Services  CM Consult  Post Acute Care Choice:  Home Health Choice offered to:  Patient  DME Arranged:    DME Agency:     HH Arranged:  RN, IV Antibiotics( provided by [facility]) HH Agency:  [facility]  Status of Service:  Completed, signed off  If discussed at Long Length of Stay Meetings, dates discussed:    Additional Comments:  Favian Kittleson, Chrystine Oiler, RN 10/21/2018, 9:56 AM

## 2018-10-21 NOTE — Care Management (Addendum)
[facility] and [facility] are unable to accept referral.  Calls to [facility] and [facility], awaiting call back.   Patient updated.   ADDENDUM: [facility] can not accept patient's insurance.

## 2018-10-21 NOTE — Progress Notes (Signed)
Inpatient Diabetes Program Recommendations  AACE/ADA: New Consensus Statement on Inpatient Glycemic Control (2019)  Target Ranges:  Prepandial:   less than 140 mg/dL      Peak postprandial:   less than 180 mg/dL (1-2 hours)      Critically ill patients:  140 - 180 mg/dL   Results for Evan Moore, [name] (MRN [id number]) as of 10/21/2018 07:34  Ref. Range 10/20/2018 07:22 10/20/2018 11:12 10/20/2018 16:12 10/20/2018 21:48 10/21/2018 02:33 10/21/2018 07:26  Glucose-Capillary Latest Ref Range: 70 - 99 mg/dL 409252 (H) 811170 (H) 914209 (H) 294 (H) 328 (H) 232 (H)   Review of Glycemic Control  Current orders for Inpatient glycemic control: Levemir 24 units BID, Novolog 15 units TID with meals, Novolog 0-20 units TID with meals, Novolog 0-5 units QHS  Inpatient Diabetes Program Recommendations:  Insulin - Basal: Please consider increasing Levemir to 28 units BID.  Thanks, Orlando PennerMarie Lorilynn Lehr, RN, MSN, CDE Diabetes Coordinator Inpatient Diabetes Program [phone] (Team Pager from 8am to 5pm)

## 2018-10-21 NOTE — Discharge Summary (Signed)
Physician Discharge Summary  Evan Moore GSU:[id number] DOB: May 30, 1971 DOA: 10/15/2018  PCP: [name], FNP  Admit date: 10/15/2018 Discharge date: 10/21/2018  PATIENT LEFT THE HOSPITAL AGAINST MEDICAL ADVICE   Brief/Interim Summary: 48 year old male with a history of diabetes, right foot ulcer who came to the emergency room after being sent to the hospital by his podiatrist.  He had a chronic wound on his right foot and was found to have osteomyelitis.  He was admitted, started on intravenous antibiotic therapy with plans to have PICC line placed for long-term home IV antibiotics.  He was seen by podiatry and had had partial fifth ray amputation performed.  Cultures revealed staph aureus.  It was suspected that he had MRSA.  Plan was to discharge the patient home with intravenous vancomycin for the next 4 weeks.  Several times were made by care management team to set up the services as an outpatient, but unfortunately due to the patient's insurance coverage, finding a home care agency to provide the services was challenging.  Attempts are being made to have the patient set up at an infusion center in [place], but this process may take 1 to 2 days.  Patient was unwilling to stay in the hospital to continue to receive antibiotics until these arrangements were made.  He was very adamant about leaving the hospital AGAINST MEDICAL ADVICE.  Prior to his discharge, PICC line was removed.  Dr. Allena Katz was also informed that he was leaving.  He reports having a follow-up appoint with Dr. Allena Katz by the end of this week.  Discharge Diagnoses:  Active Problems:   Diabetic foot infection (HCC)   Essential hypertension   Type 2 diabetes mellitus without complication (HCC)   Hyponatremia   Osteomyelitis (HCC)   Lupus (HCC)     Allergies  Allergen Reactions  . Bee Venom     Consultations:  Podiatry, Dr. Allena Katz   Procedures/Studies: Mr Foot Right Wo Contrast  Result Date:  10/15/2018 CLINICAL DATA:  Right foot infection. Soft tissue wound from spider bite. EXAM: MRI OF THE RIGHT FOREFOOT WITHOUT CONTRAST TECHNIQUE: Multiplanar, multisequence MR imaging of the right forefoot was performed. No intravenous contrast was administered. COMPARISON:  Radiographs dated 10/05/2018 FINDINGS: Bones/Joint/Cartilage There is abnormal signal throughout the fifth metatarsal. There is also focal cortical destruction of the lateral aspect of the distal fifth metatarsal adjacent to the soft tissue ulceration. The other bones of the foot demonstrate no significant abnormalities. Muscles and Tendons There is soft tissue edema around the tendons and within the muscles adjacent to the fifth metatarsal consistent with myositis. There is no discrete abscess. Soft tissues Deep soft tissue ulceration overlying the lateral aspect of the head of the fifth metatarsal. IMPRESSION: Osteomyelitis of the fifth metatarsal with adjacent myositis and cellulitis. Electronically Signed   By: [name] M.D.   On: 10/15/2018 19:14   US Arterial Abi (screening Lower Extremity)  Result Date: 10/16/2018 CLINICAL DATA:  Peripheral arterial disease. Worsening right foot wound. EXAM: NONINVASIVE PHYSIOLOGIC VASCULAR STUDY OF BILATERAL LOWER EXTREMITIES TECHNIQUE: Evaluation of both lower extremities were performed at rest, including calculation of ankle-brachial indices with single level Doppler, pressure and pulse volume recording. COMPARISON:  09/09/2018 FINDINGS: Right ABI:  1.48 Left ABI:  1.54 Right Lower Extremity:  Normal arterial waveforms at the ankle. Left Lower Extremity:  Normal arterial waveforms at the ankle. > 1.4 Non diagnostic secondary to incompressible vessel calcifications (medial arterial sclerosis of Monckeberg) IMPRESSION: Ankle-brachial indices are mildly elevated and most likely  related to calcified vessels. However, there continues to be normal waveforms at both ankles. No evidence for  significant occlusive arterial disease. Electronically Signed   By: Richarda Overlie M.D.   On: 10/16/2018 09:56   Dg Chest Port 1 View  Result Date: 10/18/2018 CLINICAL DATA:  PICC line placement. EXAM: PORTABLE CHEST 1 VIEW COMPARISON:  None. FINDINGS: Stable cardiomegaly. No pneumothorax or pleural effusion is noted. No acute pulmonary disease is noted. Bony thorax is unremarkable. Right-sided PICC line is noted with distal tip in expected position of the SVC. IMPRESSION: Right-sided PICC line is noted with distal tip in expected position of SVC. Mild cardiomegaly. No other abnormality seen in the chest. Electronically Signed   By: [name], M.D.   On: 10/18/2018 12:01   Dg Foot Complete Right  Result Date: 10/18/2018 CLINICAL DATA:  Postoperative exam after amputation of the little toe and distal fifth metatarsal. EXAM: RIGHT FOOT COMPLETE - 3+ VIEW COMPARISON:  10/15/2018 FINDINGS: Interval amputation of the fifth ray at the level of the mid fifth metatarsal. The remaining bones of the right foot are normal other than minimal degenerative arthritic changes of the first MTP joint. IMPRESSION: Interval amputation of the fifth ray at the level of the mid fifth metatarsal. Electronically Signed   By: [name] M.D.   On: 10/18/2018 16:31   Dg Foot Complete Right  Result Date: 10/15/2018 CLINICAL DATA:  Right lateral forefoot wound near the base of the fifth metatarsal. EXAM: RIGHT FOOT COMPLETE - 3+ VIEW COMPARISON:  Right foot x-rays dated September 07, 2018. FINDINGS: Prominent soft tissue defect and soft tissue swelling lateral to the fifth MTP joint with new erosive changes of the fifth metatarsal head. No subcutaneous emphysema. No acute fracture or dislocation. Joint spaces are preserved. Bone mineralization is normal. Vascular calcifications. IMPRESSION: 1. Large soft tissue ulceration along the lateral forefoot with new osteomyelitis of the fifth metatarsal head. Electronically Signed   By:  [name] M.D.   On: 10/15/2018 18:00        The results of significant diagnostics from this hospitalization (including imaging, microbiology, ancillary and laboratory) are listed below for reference.     Microbiology: Recent Results (from the past 240 hour(s))  Culture, blood (routine x 2)     Status: None   Collection Time: 10/15/18  5:07 PM  Result Value Ref Range Status   Specimen Description BLOOD RIGHT ANTECUBITAL DRAWN BY RN  Final   Special Requests   Final    BOTTLES DRAWN AEROBIC AND ANAEROBIC Blood Culture results may not be optimal due to an excessive volume of blood received in culture bottles   Culture   Final    NO GROWTH 5 DAYS Performed at [facility], [address] [place], [place] [address]    Report Status 10/20/2018 FINAL  Final  Culture, blood (routine x 2)     Status: None   Collection Time: 10/15/18  5:24 PM  Result Value Ref Range Status   Specimen Description BLOOD LEFT ANTECUBITAL  Final   Special Requests   Final    BOTTLES DRAWN AEROBIC AND ANAEROBIC Blood Culture results may not be optimal due to an excessive volume of blood received in culture bottles   Culture   Final    NO GROWTH 5 DAYS Performed at [facility], [address] [place], [place] [address]    Report Status 10/20/2018 FINAL  Final  MRSA PCR Screening     Status: Abnormal  Collection Time: 10/17/18  3:24 PM  Result Value Ref Range Status   MRSA by PCR POSITIVE (A) NEGATIVE Final    Comment:        The GeneXpert MRSA Assay (FDA approved for NASAL specimens only), is one component of a comprehensive MRSA colonization surveillance program. It is not intended to diagnose MRSA infection nor to guide or monitor treatment for MRSA infections. RESULT CALLED TO, READ BACK BY AND VERIFIED WITH: K GRAVES,RN @2029  10/17/18 [facility] Performed at [facility], [address] [place], [place] [address]   Aerobic/Anaerobic Culture (surgical/deep wound)     Status: None  (Preliminary result)   Collection Time: 10/18/18  3:02 PM  Result Value Ref Range Status   Specimen Description   Final    FOOT Performed at [facility], [address] [place], [place] [address]    Special Requests   Final    RIGHT FOOT Performed at [facility], [address] [place], [place] [address]    Gram Stain   Final    NO WBC SEEN RARE GRAM POSITIVE COCCI Performed at [facility], [address] [place], [place] [address]    Culture   Final    FEW METHICILLIN RESISTANT STAPHYLOCOCCUS AUREUS NO ANAEROBES ISOLATED; CULTURE IN PROGRESS FOR 5 DAYS    Report Status PENDING  Incomplete   Organism ID, Bacteria METHICILLIN RESISTANT STAPHYLOCOCCUS AUREUS  Final      Susceptibility   Methicillin resistant staphylococcus aureus - MIC*    CIPROFLOXACIN 2 INTERMEDIATE Intermediate     ERYTHROMYCIN >=8 RESISTANT Resistant     GENTAMICIN <=0.5 SENSITIVE Sensitive     OXACILLIN >=4 RESISTANT Resistant     TETRACYCLINE <=1 SENSITIVE Sensitive     VANCOMYCIN 1 SENSITIVE Sensitive     TRIMETH/SULFA <=10 SENSITIVE Sensitive     CLINDAMYCIN >=8 RESISTANT Resistant     RIFAMPIN <=0.5 SENSITIVE Sensitive     Inducible Clindamycin NEGATIVE Sensitive     * FEW METHICILLIN RESISTANT STAPHYLOCOCCUS AUREUS     Labs: BNP (last 3 results) No results for input(s): BNP in the last 8760 hours. Basic Metabolic Panel: Recent Labs  Lab 10/15/18 1516 10/16/18 0320 10/16/18 2226 10/17/18 0423 10/18/18 0537 10/19/18 0705  NA 129* 130*  --  134* 133* 134*  K 4.4 4.1  --  3.6 4.2 4.5  CL 93* 95*  --  101 101 100  CO2 26 26  --  25 23 26   GLUCOSE 311* 403* 439* 213* 293* 326*  BUN 25* 27*  --  23* 22* 22*  CREATININE 1.29* 1.48*  --  1.07 1.21 1.14  CALCIUM 8.9 8.6*  --  8.4* 8.5* 9.0  MG  --   --   --  2.0 1.7 1.6*   Liver Function Tests: Recent Labs  Lab 10/16/18 0320 10/17/18 0423 10/18/18 0537 10/19/18 0705  AST 12* 11* 12* 16  ALT 16 15 17 21   ALKPHOS 91 81 77 86   BILITOT 0.7 0.3 0.5 0.3  PROT 8.1 7.3 7.3 7.5  ALBUMIN 3.5 3.0* 3.0* 3.1*   No results for input(s): LIPASE, AMYLASE in the last 168 hours. No results for input(s): AMMONIA in the last 168 hours. CBC: Recent Labs  Lab 10/15/18 1516 10/16/18 0320 10/17/18 0423 10/18/18 0537 10/19/18 0705  WBC 16.5* 11.7* 9.0 10.4 10.1  NEUTROABS  --   --  6.2 7.0 7.0  HGB 13.3 12.2* 11.5* 11.3* 11.8*  HCT 39.4 37.2* 34.8* 34.8* 35.9*  MCV 87.9 88.8 89.0 89.5 89.8  PLT 430* 407* 384 386 409*   Cardiac Enzymes: No results for input(s): CKTOTAL, CKMB, CKMBINDEX, TROPONINI in the last 168 hours. BNP: Invalid input(s): POCBNP CBG: Recent Labs  Lab 10/20/18 1612 10/20/18 2148 10/21/18 0233 10/21/18 0726 10/21/18 1116  GLUCAP 209* 294* 328* 232* 157*   D-Dimer No results for input(s): DDIMER in the last 72 hours. Hgb A1c No results for input(s): HGBA1C in the last 72 hours. Lipid Profile No results for input(s): CHOL, HDL, LDLCALC, TRIG, CHOLHDL, LDLDIRECT in the last 72 hours. Thyroid function studies No results for input(s): TSH, T4TOTAL, T3FREE, THYROIDAB in the last 72 hours.  Invalid input(s): FREET3 Anemia work up No results for input(s): VITAMINB12, FOLATE, FERRITIN, TIBC, IRON, RETICCTPCT in the last 72 hours. Urinalysis No results found for: COLORURINE, APPEARANCEUR, LABSPEC, PHURINE, GLUCOSEU, HGBUR, BILIRUBINUR, KETONESUR, PROTEINUR, UROBILINOGEN, NITRITE, LEUKOCYTESUR Sepsis Labs Invalid input(s): PROCALCITONIN,  WBC,  LACTICIDVEN Microbiology Recent Results (from the past 240 hour(s))  Culture, blood (routine x 2)     Status: None   Collection Time: 10/15/18  5:07 PM  Result Value Ref Range Status   Specimen Description BLOOD RIGHT ANTECUBITAL DRAWN BY RN  Final   Special Requests   Final    BOTTLES DRAWN AEROBIC AND ANAEROBIC Blood Culture results may not be optimal due to an excessive volume of blood received in culture bottles   Culture   Final    NO GROWTH 5  DAYS Performed at [facility], [address] [place], [place] [address]    Report Status 10/20/2018 FINAL  Final  Culture, blood (routine x 2)     Status: None   Collection Time: 10/15/18  5:24 PM  Result Value Ref Range Status   Specimen Description BLOOD LEFT ANTECUBITAL  Final   Special Requests   Final    BOTTLES DRAWN AEROBIC AND ANAEROBIC Blood Culture results may not be optimal due to an excessive volume of blood received in culture bottles   Culture   Final    NO GROWTH 5 DAYS Performed at [facility], [address] [place], [place] [address]    Report Status 10/20/2018 FINAL  Final  MRSA PCR Screening     Status: Abnormal   Collection Time: 10/17/18  3:24 PM  Result Value Ref Range Status   MRSA by PCR POSITIVE (A) NEGATIVE Final    Comment:        The GeneXpert MRSA Assay (FDA approved for NASAL specimens only), is one component of a comprehensive MRSA colonization surveillance program. It is not intended to diagnose MRSA infection nor to guide or monitor treatment for MRSA infections. RESULT CALLED TO, READ BACK BY AND VERIFIED WITH: K GRAVES,RN @2029  10/17/18 [facility] Performed at [facility], [address] [place], [place] [address]   Aerobic/Anaerobic Culture (surgical/deep wound)     Status: None (Preliminary result)   Collection Time: 10/18/18  3:02 PM  Result Value Ref Range Status   Specimen Description   Final    FOOT Performed at [facility], [address], [place], [place] [address]    Special Requests   Final    RIGHT FOOT Performed at [facility], [address] [place], [place] [address]    Gram Stain   Final    NO WBC SEEN RARE GRAM POSITIVE COCCI Performed at [facility], [address] [place], [place] [address]    Culture   Final    FEW METHICILLIN RESISTANT  STAPHYLOCOCCUS AUREUS NO ANAEROBES ISOLATED; CULTURE IN PROGRESS FOR 5 DAYS    Report Status PENDING  Incomplete   Organism ID, Bacteria METHICILLIN RESISTANT  STAPHYLOCOCCUS AUREUS  Final      Susceptibility   Methicillin resistant staphylococcus aureus - MIC*    CIPROFLOXACIN 2 INTERMEDIATE Intermediate     ERYTHROMYCIN >=8 RESISTANT Resistant     GENTAMICIN <=0.5 SENSITIVE Sensitive     OXACILLIN >=4 RESISTANT Resistant     TETRACYCLINE <=1 SENSITIVE Sensitive     VANCOMYCIN 1 SENSITIVE Sensitive     TRIMETH/SULFA <=10 SENSITIVE Sensitive     CLINDAMYCIN >=8 RESISTANT Resistant     RIFAMPIN <=0.5 SENSITIVE Sensitive     Inducible Clindamycin NEGATIVE Sensitive     * FEW METHICILLIN RESISTANT STAPHYLOCOCCUS AUREUS     Time coordinating discharge: 35mins  SIGNED:   Erick BlinksJehanzeb Avian Konigsberg, MD  [facility] 10/21/2018, 8:12 PM Pager   If 7PM-7AM, please contact night-coverage [email or web address] Password [email or web address]

## 2018-10-21 NOTE — Care Management (Signed)
Patient and wife updated. No Home Health agency acceptance yet.  Patient reports he is leaving today no matter what. RN and attending notified.

## 2018-10-21 NOTE — Progress Notes (Signed)
Patient refused am lab draws this am.

## 2018-10-21 NOTE — Care Management (Signed)
PHARMACY CONSULT NOTE FOR:  OUTPATIENT  PARENTERAL ANTIBIOTIC THERAPY (OPAT)  Indication: osteomyelitis Regimen: vancomycin 1g IV q12h End date: 11/12/2018

## 2018-10-21 NOTE — Progress Notes (Signed)
PHARMACY CONSULT NOTE FOR:  OUTPATIENT  PARENTERAL ANTIBIOTIC THERAPY (OPAT)  Indication: osteomyelitis Regimen: vancomycin 1g IV q12h End date: 11/12/2018  IV antibiotic discharge orders are pended. To discharging provider:  please sign these orders via discharge navigator,  Select New Orders & click on the button choice - Manage This Unsigned Work.     Thank you for allowing pharmacy to be a part of this patient's care.  [place] 10/21/2018, 11:42 AM

## 2018-10-21 NOTE — Care Management (Signed)
Patient Information   Patient Name Evan Moore, Evan Moore ([id number]) Sex Male DOB 1971/07/31  Room Bed  A302 A302-01  Patient Demographics   Address [address] [place] [address] Phone [phone] (Home) E-mail Address [email or web address]  Patient Ethnicity & Race   Ethnic Group Patient Race  Not Hispanic or Latino White or Caucasian  Emergency Contact(s)   Name Relation Home Work Mobile  Evan Moore,Evan Moore Spouse   [phone]  Documents on File    Status Date Received Description  Documents for the Patient  [place] HIPAA NOTICE OF PRIVACY - Scanned Not Received    [place] E-Signature HIPAA Notice of Privacy Signed 09/07/18   Driver's License Not Received 09/07/18   Insurance Card Received 09/07/18 [place] [email or web address]  Advance Directives/Living Will/HCPOA/POA Not Received    Other Photo ID Not Received    [place] E-Signature HIPAA Notice of Privacy Signed 10/15/18   Patient Photo   Photo of Patient  Documents for the Encounter  AOB (Assignment of Insurance Benefits) Not Received    E-signature AOB Signed 10/15/18   MEDICARE RIGHTS Not Received    E-signature Medicare Rights     ED Patient Billing Extract   ED PB Billing Extract  HIM Release of Information Output   [id number]  Admission Information   Current Information   Attending Provider Admitting Provider Admission Type Admission Status  Erick Blinks, MD Randa Lynn, MD Emergency Admission (Confirmed)       Admission Date/Time Discharge Date Hospital Service Auth/Cert Status  10/15/18 04:54 PM  Internal Medicine Incomplete       Hospital Area Unit Room/Bed   [facility] AP-DEPT 300 A302/A302-01        Admission   Complaint  MRSA  Hospital Account   Name Acct ID Class Status Primary Coverage  Evan Moore, Evan Moore [id number] Inpatient Open [place] OUT [place]      Guarantor Account (for Hospital Account [id number])   Name Relation to Pt  Service Area Active? Acct Type  Evan Moore Self [facility] Yes Personal/Family  Address Phone    [address] [place], [place] [address] [phone](H)        Coverage Information (for Hospital Account [id number])   F/O Payor/Plan Precert #  MEDICA[place]   Subscriber Subscriber #  Evan Moore, Evan Moore [id number]  Address Phone  [address] [address] [place], [place] [address] [phone]       Care Everywhere ID:  [id number]

## 2018-10-21 NOTE — Progress Notes (Signed)
Podiatry Progress note:   Patient seen at bedside. Patient is s/p partial 5th ray amputation DOS 10/18/2018. POD #3. Patient is resting on bed. Patient had less pain over night. He is complaining that he did not sleep well because someone kept waking him up every 3 hours.   Physical exam: Dressing today was not removed. Dressing is clean, dry and intact.    OR wound cultures: Growing Staph aureus. Will follow up on sensitivity report.  OR bone pathology: pending.  Clean margin is pending.   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  Uncontrolled diabetes with neuropathy.   Plan: Patient examined and evaluated at bedside along with his wife.  Please DO NOT CHANGE the dressing. Keep the dressing clean, dry and intact.  Patient can be partial weightbearing with surgical shoe or knee scooter.  Patient will need 4 to 6 weeks of IV antibiotics and infusion company to assists with it.  Pending OR pathology and deep wound culture report. Patient will not need any assistance from wound care point. He will need Nurse from IV infusion. Please give patient percocet RX for 5 days every six hours for pain as needed upon discharge to home.    Patient is stable to discharge from my point of view to home. He can follow up with me on Friday in my [place] office. I will contact the patient with date and time.

## 2018-10-21 NOTE — Progress Notes (Signed)
Patient signed AMA paperwork after stating understanding that anything that happens to him as a result is his responsibility, patient's wife in the room. [place] Foley RN in to d/c PICC line. MD aware

## 2018-10-24 LAB — AEROBIC/ANAEROBIC CULTURE W GRAM STAIN (SURGICAL/DEEP WOUND)

## 2018-10-24 LAB — AEROBIC/ANAEROBIC CULTURE (SURGICAL/DEEP WOUND): GRAM STAIN: NONE SEEN

## 2020-01-19 IMAGING — CR DG CHEST 1V PORT
2 series · 2 of 2 positions shown · non-contrast
Comparison: None.

CLINICAL DATA: PICC line placement.

EXAM:
PORTABLE CHEST 1 VIEW

[portable (1 of 2)]
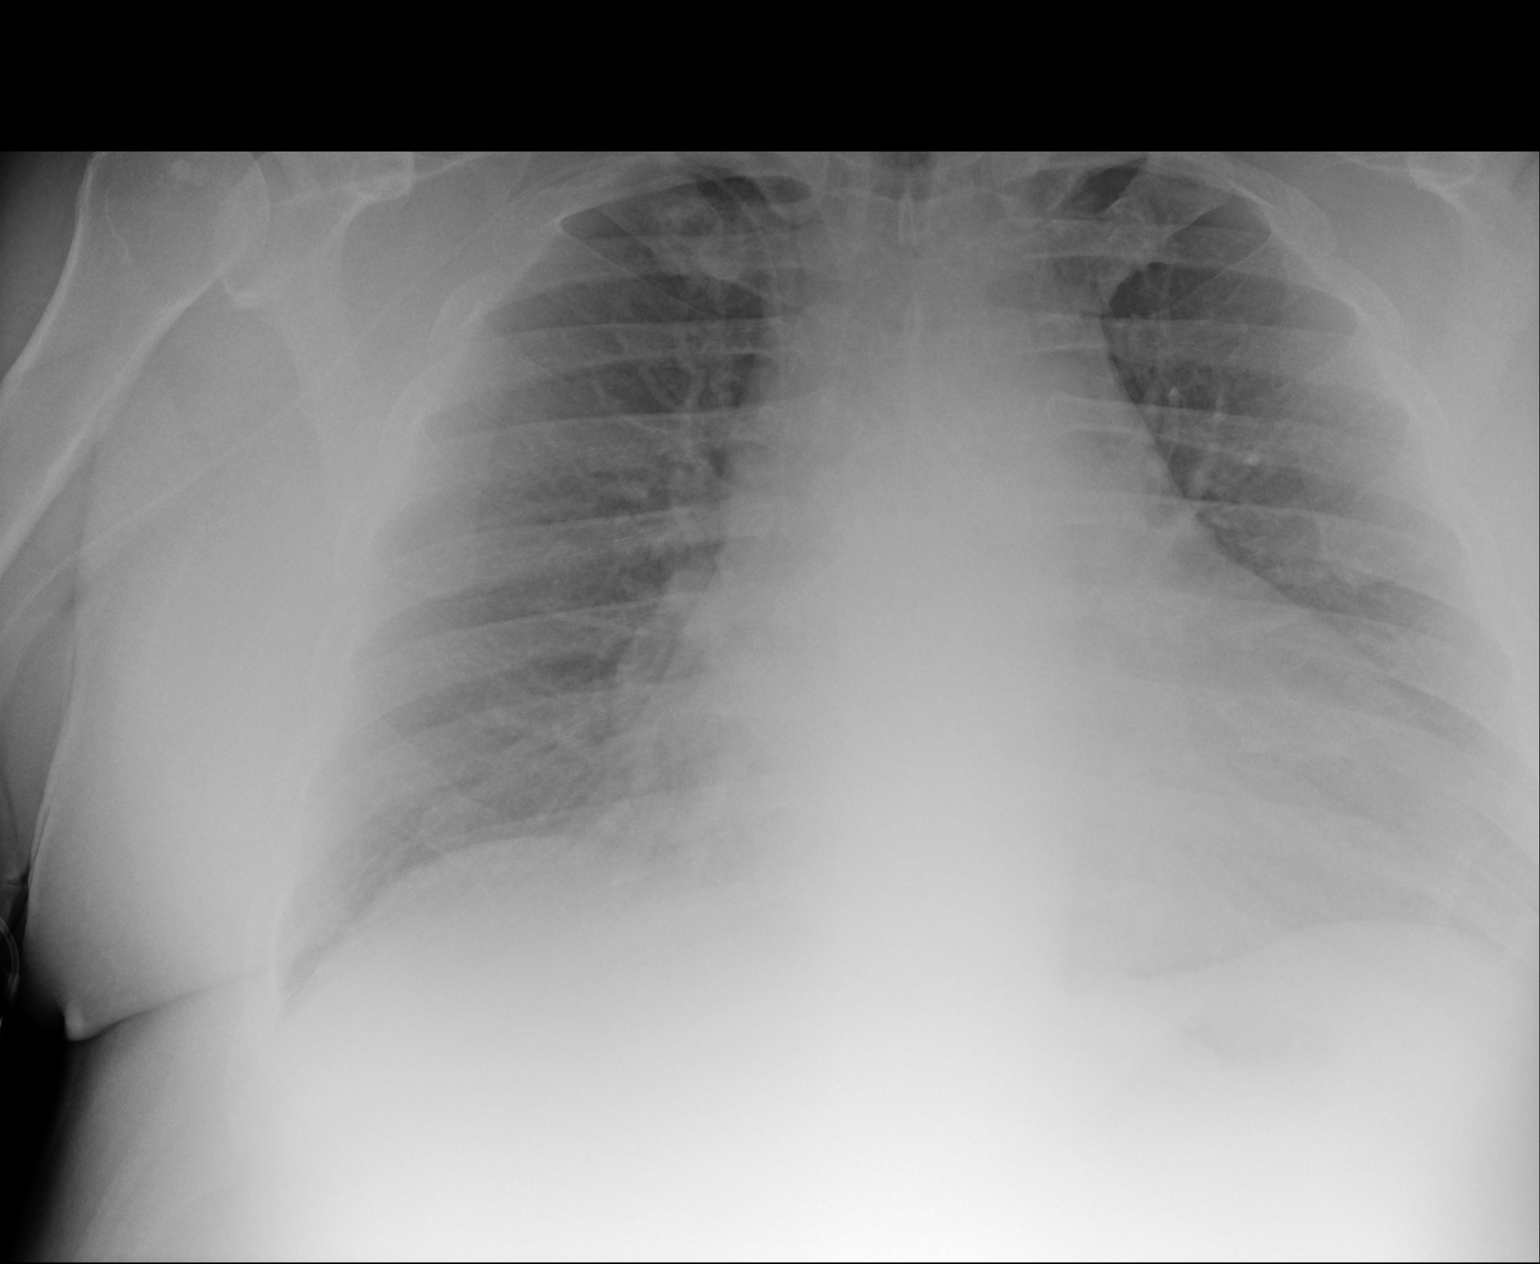

[portable (2 of 2)]
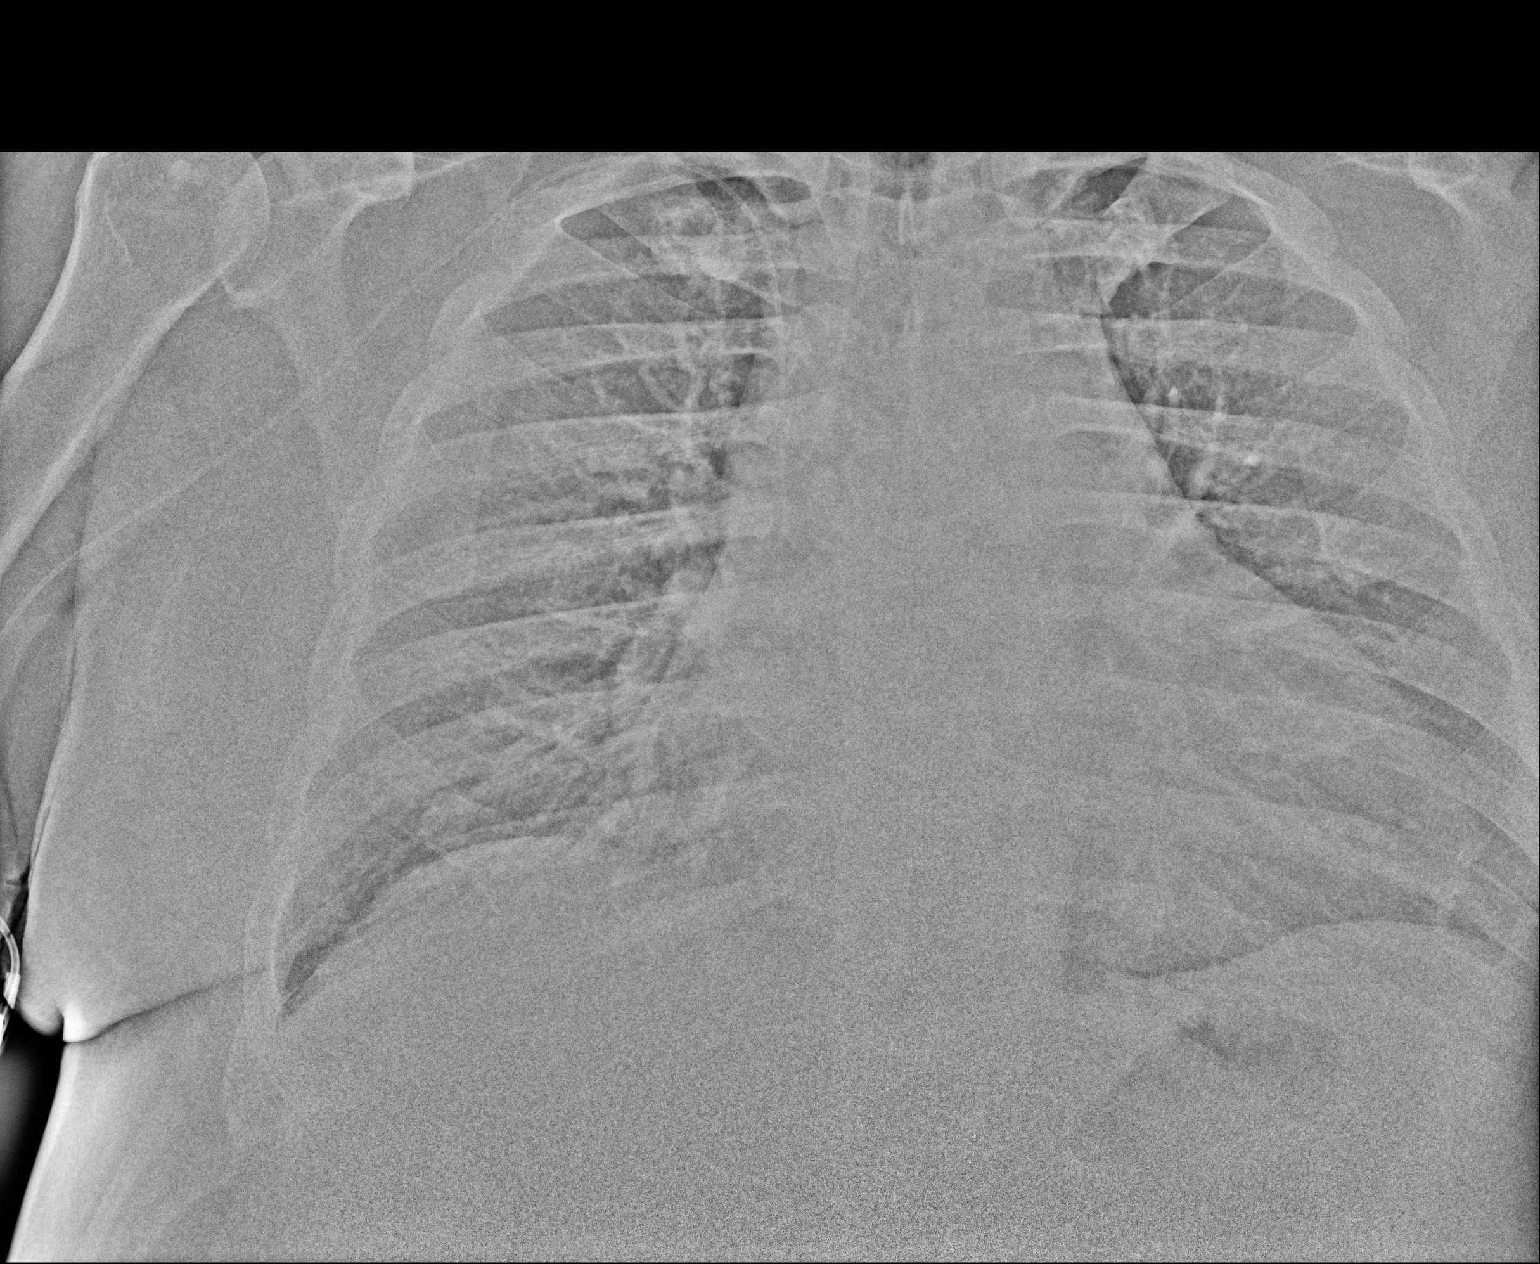

[2 of 2 positions shown; findings below may reference images not displayed]

FINDINGS: Stable cardiomegaly. No pneumothorax or pleural effusion is noted.
No acute pulmonary disease is noted. Bony thorax is unremarkable.
Right-sided PICC line is noted with distal tip in expected position
of the SVC.
IMPRESSION: Right-sided PICC line is noted with distal tip in expected position
of SVC. Mild cardiomegaly. No other abnormality seen in the chest.

## 2020-02-14 DEATH — deceased
